# Patient Record
Sex: Female | Born: 1935 | Race: White | Hispanic: No | Smoking: Never smoker
Health system: Southern US, Community
[De-identification: ages and names within clinical notes are randomized; demographics above are authoritative.]

## PROBLEM LIST (undated history)

## (undated) DIAGNOSIS — N189 Chronic kidney disease, unspecified: Secondary | ICD-10-CM

## (undated) DIAGNOSIS — E46 Unspecified protein-calorie malnutrition: Secondary | ICD-10-CM

## (undated) DIAGNOSIS — I10 Essential (primary) hypertension: Secondary | ICD-10-CM

## (undated) HISTORY — PX: THYROIDECTOMY: SHX17

## (undated) HISTORY — PX: ABDOMINAL HYSTERECTOMY: SHX81

## (undated) HISTORY — DX: Chronic kidney disease, unspecified: N18.9

---

## 1998-02-04 ENCOUNTER — Ambulatory Visit (HOSPITAL_BASED_OUTPATIENT_CLINIC_OR_DEPARTMENT_OTHER): Admission: RE | Admit: 1998-02-04 | Discharge: 1998-02-04 | Payer: Self-pay | Admitting: Plastic Surgery

## 1999-12-23 ENCOUNTER — Ambulatory Visit (HOSPITAL_BASED_OUTPATIENT_CLINIC_OR_DEPARTMENT_OTHER): Admission: RE | Admit: 1999-12-23 | Discharge: 1999-12-23 | Payer: Self-pay | Admitting: Plastic Surgery

## 2002-01-17 ENCOUNTER — Ambulatory Visit (HOSPITAL_COMMUNITY): Admission: RE | Admit: 2002-01-17 | Discharge: 2002-01-17 | Payer: Self-pay | Admitting: Surgery

## 2002-01-17 ENCOUNTER — Encounter: Payer: Self-pay | Admitting: Surgery

## 2002-02-15 ENCOUNTER — Encounter: Payer: Self-pay | Admitting: Surgery

## 2002-02-18 ENCOUNTER — Observation Stay (HOSPITAL_COMMUNITY): Admission: RE | Admit: 2002-02-18 | Discharge: 2002-02-19 | Payer: Self-pay | Admitting: Surgery

## 2002-02-18 ENCOUNTER — Encounter (INDEPENDENT_AMBULATORY_CARE_PROVIDER_SITE_OTHER): Payer: Self-pay | Admitting: Specialist

## 2004-12-03 ENCOUNTER — Emergency Department (HOSPITAL_COMMUNITY): Admission: EM | Admit: 2004-12-03 | Discharge: 2004-12-03 | Payer: Self-pay | Admitting: Emergency Medicine

## 2007-12-02 ENCOUNTER — Encounter: Admission: RE | Admit: 2007-12-02 | Discharge: 2007-12-02 | Payer: Self-pay | Admitting: Internal Medicine

## 2010-05-20 ENCOUNTER — Emergency Department (HOSPITAL_COMMUNITY): Payer: Medicare Other

## 2010-05-20 ENCOUNTER — Observation Stay (HOSPITAL_COMMUNITY)
Admission: EM | Admit: 2010-05-20 | Discharge: 2010-05-22 | Disposition: A | Discharge status: Home / Self Care | Payer: Medicare Other | Attending: Internal Medicine | Admitting: Internal Medicine

## 2010-05-20 ENCOUNTER — Encounter (HOSPITAL_COMMUNITY): Payer: Self-pay | Admitting: Radiology

## 2010-05-20 DIAGNOSIS — G459 Transient cerebral ischemic attack, unspecified: Secondary | ICD-10-CM | POA: Insufficient documentation

## 2010-05-20 DIAGNOSIS — F29 Unspecified psychosis not due to a substance or known physiological condition: Principal | ICD-10-CM | POA: Insufficient documentation

## 2010-05-20 DIAGNOSIS — I10 Essential (primary) hypertension: Secondary | ICD-10-CM | POA: Insufficient documentation

## 2010-05-20 DIAGNOSIS — R279 Unspecified lack of coordination: Secondary | ICD-10-CM | POA: Insufficient documentation

## 2010-05-20 DIAGNOSIS — J4489 Other specified chronic obstructive pulmonary disease: Secondary | ICD-10-CM | POA: Insufficient documentation

## 2010-05-20 DIAGNOSIS — J449 Chronic obstructive pulmonary disease, unspecified: Secondary | ICD-10-CM | POA: Insufficient documentation

## 2010-05-20 DIAGNOSIS — M810 Age-related osteoporosis without current pathological fracture: Secondary | ICD-10-CM | POA: Insufficient documentation

## 2010-05-20 HISTORY — DX: Essential (primary) hypertension: I10

## 2010-05-20 LAB — CBC
Hemoglobin: 16.2 g/dL — ABNORMAL HIGH (ref 12.0–15.0)
MCH: 30.3 pg (ref 26.0–34.0)
MCHC: 34.8 g/dL (ref 30.0–36.0)
Platelets: 72 10*3/uL — ABNORMAL LOW (ref 150–400)
RBC: 5.35 MIL/uL — ABNORMAL HIGH (ref 3.87–5.11)

## 2010-05-20 LAB — COMPREHENSIVE METABOLIC PANEL
BUN: 15 mg/dL (ref 6–23)
CO2: 25 mEq/L (ref 19–32)
Calcium: 9.8 mg/dL (ref 8.4–10.5)
Chloride: 100 mEq/L (ref 96–112)
Creatinine, Ser: 1.13 mg/dL (ref 0.4–1.2)
GFR calc non Af Amer: 46 mL/min — ABNORMAL LOW (ref >60)
Total Bilirubin: 0.7 mg/dL (ref 0.3–1.2)

## 2010-05-20 LAB — DIFFERENTIAL
Basophils Absolute: 0 10*3/uL (ref 0.0–0.1)
Basophils Relative: 0 % (ref 0–1)
Eosinophils Absolute: 0 10*3/uL (ref 0.0–0.7)
Monocytes Absolute: 0.4 10*3/uL (ref 0.1–1.0)
Monocytes Relative: 8 % (ref 3–12)
Neutro Abs: 2.9 10*3/uL (ref 1.7–7.7)
Neutrophils Relative %: 62 % (ref 43–77)

## 2010-05-20 LAB — URINALYSIS, ROUTINE W REFLEX MICROSCOPIC
Nitrite: NEGATIVE
Specific Gravity, Urine: 1.01 (ref 1.005–1.030)
Urobilinogen, UA: 0.2 mg/dL (ref 0.0–1.0)
pH: 6 (ref 5.0–8.0)

## 2010-05-20 LAB — PROTIME-INR
INR: 0.93 (ref 0.00–1.49)
Prothrombin Time: 12.7 seconds (ref 11.6–15.2)

## 2010-05-20 LAB — POCT CARDIAC MARKERS: Myoglobin, poc: 84.4 ng/mL (ref 12–200)

## 2010-05-21 DIAGNOSIS — G459 Transient cerebral ischemic attack, unspecified: Secondary | ICD-10-CM

## 2010-05-21 DIAGNOSIS — I10 Essential (primary) hypertension: Secondary | ICD-10-CM

## 2010-05-21 DIAGNOSIS — J449 Chronic obstructive pulmonary disease, unspecified: Secondary | ICD-10-CM

## 2010-05-21 DIAGNOSIS — F29 Unspecified psychosis not due to a substance or known physiological condition: Secondary | ICD-10-CM

## 2010-05-21 LAB — COMPREHENSIVE METABOLIC PANEL
ALT: 18 U/L (ref 0–35)
AST: 21 U/L (ref 0–37)
CO2: 27 mEq/L (ref 19–32)
Chloride: 107 mEq/L (ref 96–112)
Creatinine, Ser: 0.97 mg/dL (ref 0.4–1.2)
GFR calc Af Amer: >60 mL/min (ref >60)
GFR calc non Af Amer: 54 mL/min — ABNORMAL LOW (ref >60)
Sodium: 140 mEq/L (ref 135–145)
Total Bilirubin: 0.9 mg/dL (ref 0.3–1.2)

## 2010-05-21 LAB — CBC
HCT: 40.9 % (ref 36.0–46.0)
Hemoglobin: 13.7 g/dL (ref 12.0–15.0)
MCH: 29.1 pg (ref 26.0–34.0)
RBC: 4.71 MIL/uL (ref 3.87–5.11)

## 2010-05-21 LAB — LIPID PANEL
Total CHOL/HDL Ratio: 5 RATIO
VLDL: 27 mg/dL (ref 0–40)

## 2010-05-21 LAB — TSH: TSH: 4.01 u[IU]/mL (ref 0.350–4.500)

## 2010-05-21 LAB — DIFFERENTIAL
Basophils Relative: 0 % (ref 0–1)
Lymphocytes Relative: 30 % (ref 12–46)
Lymphs Abs: 1 10*3/uL (ref 0.7–4.0)
Monocytes Relative: 11 % (ref 3–12)
Neutro Abs: 1.9 10*3/uL (ref 1.7–7.7)
Neutrophils Relative %: 57 % (ref 43–77)

## 2010-05-21 LAB — URINE CULTURE: Culture  Setup Time: 201203081421

## 2010-05-21 LAB — RPR: RPR Ser Ql: NONREACTIVE

## 2010-05-21 LAB — HEMOGLOBIN A1C
Hgb A1c MFr Bld: 5.7 % — ABNORMAL HIGH (ref <5.7)
Mean Plasma Glucose: 117 mg/dL — ABNORMAL HIGH (ref <117)

## 2010-05-21 LAB — VITAMIN B12: Vitamin B-12: 807 pg/mL (ref 211–911)

## 2010-05-28 NOTE — Discharge Summary (Signed)
NAMEFERN, Amy Ward                ACCOUNT NO.:  [id number]  MEDICAL RECORD NO.:  [id number]           PATIENT TYPE:  I  LOCATION:  3002                         FACILITY:  [facility]  PHYSICIAN:  Thora Lance, M.D.  DATE OF BIRTH:  February 05, 1922  DATE OF ADMISSION:  05/20/2010 DATE OF DISCHARGE:  05/22/2010                              DISCHARGE SUMMARY   REASON FOR ADMISSION:  An 77 year old female with history of hypertension, osteoporosis and COPD for chest x-ray who presented with her son.  On the morning of admission, her son reports she was "not feeling right in the head."  Her mentation seem to be slightly off but no specific neurologic deficits that is visual change, speech change or weakness in extremity.  In the ER, the patient was awake, alert, and oriented.  It was reported by nursing staff that she had some trouble ambulating.  She was admitted to rule out a TIA.  Significant findings at admission; temperature 97.7, blood pressure 157/62, heart rate 92, respirations 20, oxygen saturation 98% on 2 L. Lungs clear.  Heart, regular rate and rhythm without murmur, gallop or rub.  Abdomen was benign.  Neurologic, cranial nerves II-XII were intact.  Chest x-ray showed changes of COPD.  A CT scan of the brain showed mild atrophy and chronic ischemic white matter disease.  MRI of the brain showed no acute intracranial abnormalities.  LABORATORY FINDINGS:  CBC; WBC 4.6, hemoglobin 16.2, hematocrit 46.2, platelet count 72 which has been chronic.  Sodium 138, potassium 3.7, chloride 100, bicarbonate is 22, BUN 15, creatinine 1.13, glucose 119, total bilirubin 0.7, alkaline phos 53, AST 28, ALT 17, total protein 7.3, albumin 4.3, calcium 9.8, troponin-I 0.03.  Urinalysis was negative.  HOSPITAL COURSE:  The patient was admitted for complaints of transient confusion and possible gait disorder.  By the second hospital day she had no complaints and was back to her baseline status.  She  was started on low dose of aspirin.  She was seen by physical therapy on March 10 and in discussion with physical therapy she felt that she was probably back at her baseline.  She did feel like she had some deconditioning and possibly increased fall risk and recommended home physical therapy.  The patient was given IV fluids and BUN and creatinine at discharge was 9 and 0.97.  Urine culture showed no growth.  DISCHARGE DIAGNOSES: 1. Confusion. 2. Possible gait disorder. 3. Hypertension. 4. Osteoporosis.  PROCEDURES: 1. CT scan of the brain without contrast. 2. MRI of the brain.  DISCHARGE MEDICATIONS: 1. Aspirin 81 mg 1 p.o. daily. 2. Citracal 1 tablet b.i.d. 3. Lopressor 25 mg one-half tablet by mouth twice a day. 4. Multivitamin 1 tablet by mouth once a day. 5. Vitamin D 1000 units 2 capsules by mouth 3 times a day.  DISPOSITION:  Discharged to home.  Follow up in 1-2 weeks, Dr. [name].  Home health physical therapy arranged.  CODE STATUS:  Full code.  DIET:  Low-sodium diet.  Activity as tolerated.          ______________________________ Thora Lance, M.D.  JJG/MEDQ  D:  05/22/2010  T:  05/22/2010  Job:  [id number]  cc:   [name], M.D.  Electronically Signed by Kirby Funk M.D. on 05/28/2010 06:21:43 PM

## 2010-06-04 NOTE — H&P (Signed)
Amy Ward, Amy Ward                ACCOUNT NO.:  [id number]  MEDICAL RECORD NO.:  [id number]           PATIENT TYPE:  E  LOCATION:  [facility]                         FACILITY:  [facility]  PHYSICIAN:  Rock Nephew, MD       DATE OF BIRTH:  December 31, 1921  DATE OF ADMISSION:  05/20/2010 DATE OF DISCHARGE:                             HISTORY & PHYSICAL   PRIMARY CARE PHYSICIAN:  [name], MD  CHIEF COMPLAINT:  Confusion and ataxia.  HISTORY OF PRESENT ILLNESS:  This is an 77 year old female with a history of hypertension, osteoporosis, COPD per chest x-ray, and history of parathyroidectomy comes in with a chief complaint of confusion and ataxia.  The patient's son, Amy Ward is present, his phone number is believed to be [phone].  The patient woke up around 6 a.m., called her son that she was not feeling right in the head.  He feels that her mentation is slightly off, but he is not specific on what exactly is off.  The patient is alert, awake, and oriented x3.  She talks excessively.  She just describes that she is off.  However, speaking to the nursing staff, the patient had trouble ambulating.  She is able to speak without difficulty.  The son says that he has not noticed anything unusual in her speech.  The patient came to the Emergency Department. She was hypertensive slightly, however CT of the head was unremarkable. She had an MRI of the brain, which showed no acute intracranial abnormality, chronic calvarial hyperostosis and sclerosis most pronounced on the left anterior convexity as seen on today CTs.  The patient also had a chest x-ray, which showed changes of COPD with chronic bronchitic changes suggest no acute abnormalities.  PAST MEDICAL HISTORY: 1. Hypertension. 2. Osteoporosis. 3. COPD per chest x-ray.  PAST SURGICAL HISTORY: 1. Parathyroidectomy. 2. Hysterectomy.  SOCIAL HISTORY:  The patient is smokes about two cigarettes a week per her.  She has been exposed  to secondhand smoke.  No alcohol, no drugs. She lives for herself.  ALLERGIES:  She is allergic to Codeine.  HOME MEDICATIONS: 1. She takes Lopressor 12.5 mg p.o. b.i.d. 2. She also takes some vitamin D.  REVIEW OF SYSTEMS:  She reports that her head feels different.  She reports she has a history of double vision.  She denies any chest pain or any shortness of breath.  She denies any nausea, vomiting, or abdominal pain.  She denies any constipation or any diarrhea.  She denies any pain in her legs.  PHYSICAL EXAMINATION:  VITAL SIGNS:  Temperature 97.7, blood pressure 157/62, pulse rate has ranged from 92-117, respiratory rate is 20, and 98% saturation on 2 L nasal cannula. HEAD, EYES, EARS, NOSE AND THROAT:  Normocephalic and atraumatic. Pupils are equally round and reactive to light. CARDIOVASCULAR:  She is S1, S2.  Regular rate rhythm.  No murmurs, no rubs. LUNGS:  Clear to auscultation bilaterally.  No wheezes.  No rhonchi. ABDOMEN:  Soft, nontender, nondistended.  Bowel sounds are positive.  No guarding.  No rebound tenderness. EXTREMITIES:  No lower extremity edema is evident.  NEUROLOGIC:  Cranial nerves II through XII are focally intact.  She is little bit slow in the finger-nose-finger touch examination.  RADIOLOGICAL STUDIES:  Chest x-ray shows changes of COPD with chronic bronchitic changes suggests no acute abnormality identified.  The patient's CT of the head shows mild atrophy and chronic ischemic white matter disease.  No acute intracranial abnormality identified.  The patient's MRI of the brain shows no acute intracranial abnormalities, chronic hyperostosis and sclerosis most pronounced on left anterior convexity seen on today's CT.  LABORATORY STUDIES:  The patient's WBC count is 4.6, hemoglobin 16.2, hematocrit 46.2, MCV of 86.9, and platelets are low at 72.  The patient has had thrombocytopenia in the past, neutrophils are 62.  Sodium is 138, potassium 3.7,  chloride 100, bicarbonate 22, BUN 15, creatinine 1.13, glucose 119, total bili 0.7, alk phos 53, AST 28, ALT 17, total protein 7.3, albumin 4.3, calcium 9.8, troponin-I point-of-care marker less than 0.05.  Urinalysis with specific gravity 1.010, negative leukocyte esterase, and negative nitrites.  IMPRESSION AND PLAN:  An 77 year old female admitted with transient confusion and ataxia, nonspecific complaints consisting of transient ischemic attack.  1. Transient ischemic attack, etiology is not clear for this.  The     patient will have a full transient ischemic attack workup.  She has     already had an MRI.  We will get 2-D echo, carotid Dopplers PT/OT     evaluation, and neuro checks. 2. Confusion.  Confusion could be related to transient ischemic     attack, confusion could also be possibly dementia.  We will check     TSH, B12, and RPR. 3. Hypertension.  The patient will be placed on metoprolol 12.5 mg     p.o. b.i.d. which is her home medication dose. 4. Osteoporosis.  Once the med rec was done, the patient will be     continued on calcium and vitamin D. 5. Chronic obstructive pulmonary disease per chest x-ray.  The patient     will get p.r.n. nebulizations as needed for DVT prophylaxis.  The     patient will be placed on Lovenox. 6. Code status was discussed with the patient's family.  The patient     and family wishes for the patient to be DNR.  No code blue.  The patient will be seen by Dr. [name], Deboraha Sprang at [facility] tomorrow.    Rock Nephew, MD    NH/MEDQ  D:  05/20/2010  T:  05/20/2010  Job:  [id number]  cc:   [name], M.D.  Electronically Signed by Rock Nephew MD on 06/04/2010 09:40:37 PM

## 2010-07-30 NOTE — Op Note (Signed)
NAME:  Amy Ward, Amy Ward                          ACCOUNT NO.:  [id number]   MEDICAL RECORD NO.:  [id number]                   PATIENT TYPE:  INP   LOCATION:  NA                                   FACILITY:  [facility]   PHYSICIAN:  Velora Heckler, M.D.                DATE OF BIRTH:  1922-02-19   DATE OF PROCEDURE:  02/18/2002  DATE OF DISCHARGE:                                 OPERATIVE REPORT   PREOPERATIVE DIAGNOSIS:  Primary hyperparathyroidism.   POSTOPERATIVE DIAGNOSIS:  Primary hyperparathyroidism.   OPERATION/PROCEDURE:  Minimally invasive parathyroidectomy (left inferior  gland).   SURGEON:  Velora Heckler, M.D.   ASSISTANT:  Gita Kudo, M.D.   ANESTHESIA:  General per Dr. Krista Blue.   ESTIMATED BLOOD LOSS:  Minimal.   PREPARATION:  Betadine.   COMPLICATIONS:  None.   INDICATIONS:  The patient is a 77 year old white female seen at the request  of Dr. [name] for signs of primary hyperparathyroidism.  The patient  has had elevated serum calcium levels in excess of 12.  Intake PPH level was  measured at 413.  Sestamibi scan shows localization of parathyroid adenoma  in the left inferior position.  The patient is now brought to the operating  room for excision.   DESCRIPTION OF PROCEDURE:  The procedure was done in OR #16 at [facility].  The patient is brought to the operating room and placed  in the supine position on the operating room table.  Following  administration of general anesthesia, the patient was prepped and draped in  the usual aseptic fashion.  After ascertaining an adequate level of  anesthesia had been obtained, the neck is scanned with the NeoProbe.  An  area of increased radioactivity is noted in the low left neck.  A transverse  incision is made with the #10 blade.  Dissection is carried down into the  subcutaneous tissues and through the platysma.  Hemostasis is obtained with  the electrocautery.  A Weitlaner retractor  is placed for exposure.  Skin  flaps are developed just below the platysma cephalad and caudad.  Strap  muscles are incised in the midline and reflected laterally.  Thyroid gland  is identified and rolled medially.  There is a large parathyroid gland in  the left inferior position which is easily identified visually.  This is  confirmed with the NeoProbe to have uptake approximately 2-1/2 times  background levels.  Gland is gently dissected out with blunt dissection  taking care to prevent injury to the recurrent laryngeal nerve.  A vascular  pedicle is isolated over a right angle hemostat.  It is divided between the  small Ligaclips.  Gland is completely excised.  Ex vivo counts are  approximately 250% of background levels.  Gland is submitted fresh to  pathology where frozen section biopsy confirms parathyroid tissue consistent  with parathyroid  adenoma according to Dr. Charlott Rakes.  Good hemostasis is obtained in the left neck.  Small piece of Surgicel was  placed in the bed of the gland.  Strap muscles were reapproximated in the  midline with interrupted 3-0 Vicryl sutures.  Platysma is closed with  interrupted 3-0 Vicryl sutures.  Skin edges are anesthetized with local  Marcaine anesthetic.  Skin edges closed with a running 4-  0 Vicryl subcuticular suture.  Wound was washed and dried and Steri-Strips  are applied.  Sterile gauze dressings are applied.  The patient is awakened  from anesthesia and brought to the recovery room in stable condition.  The  patient tolerated the procedure well.                                                Velora Heckler, M.D.    TMG/MEDQ  D:  02/18/2002  T:  02/18/2002  Job:  [id number]   cc:   [name], M.D.  [address] [place]  [place] [address]  Fax: [phone]

## 2011-02-17 ENCOUNTER — Other Ambulatory Visit (HOSPITAL_COMMUNITY): Payer: Self-pay | Admitting: Internal Medicine

## 2011-02-17 DIAGNOSIS — R1310 Dysphagia, unspecified: Secondary | ICD-10-CM

## 2011-02-17 DIAGNOSIS — K469 Unspecified abdominal hernia without obstruction or gangrene: Secondary | ICD-10-CM

## 2011-02-25 ENCOUNTER — Ambulatory Visit (HOSPITAL_COMMUNITY)
Admission: RE | Admit: 2011-02-25 | Discharge: 2011-02-25 | Disposition: A | Discharge status: Home / Self Care | Payer: Medicare Other | Source: Ambulatory Visit | Attending: Internal Medicine | Admitting: Internal Medicine

## 2011-02-25 DIAGNOSIS — K469 Unspecified abdominal hernia without obstruction or gangrene: Secondary | ICD-10-CM | POA: Insufficient documentation

## 2011-02-25 DIAGNOSIS — R1310 Dysphagia, unspecified: Secondary | ICD-10-CM | POA: Insufficient documentation

## 2011-02-25 NOTE — Procedures (Signed)
Modified Barium Swallow Procedure Note Patient Details  Name: Amy Ward MRN: [id number] Date of Birth: [birth year]  Today's Date: 02/25/2011 Time:  -   HPI: : 77 y.o. female referred for OPMBS secondary to reports of "strangling" with liquids.  Pt with hx of essential hypertension, diplopia, and anxiety.  Pt states that she has a large hiatal hernia.  Daughter present for study.   Clinical Impression: Suspected primary esophageal dysphagia.   Pt presents with a normal oropharyngeal swallow.  Esophageal screen revealed barium stasis, often filling esophageal column.  Backflow of barium into cervical esophagus also observed.  Recommendations:  1.  Continue regular diet with thin liquids.  2.  Consider esophageal assessment  Postural Changes and/or Swallow Maneuvers: Seated upright 90 degrees; Upright 30-60 min after meal   Individuals Consulted Report Sent to : Referring physician   General:  Other Pertinent Information Type of Study: Initial MBS Diet Prior to this Study: Regular;Thin liquids Temperature Spikes Noted: No Respiratory Status: Room air History of Intubation: No Behavior/Cognition: Alert;Distractible Oral Cavity - Dentition: Adequate natural dentition Oral Motor / Sensory Function: Within functional limits Patient Positioning: Upright in chair Baseline Vocal Quality: Normal Volitional Cough: Strong Volitional Swallow: Able to elicit Anatomy: Within functional limits Pharyngeal Secretions: Not observed secondary MBS Ice chips: Not tested   Oral Phase Oral Preparation/Oral Phase Oral Phase: Impaired Oral - Solids Oral - Puree: Other (Comment) (prolonged mastication, but thorough) Oral - Mechanical Soft: Other (Comment) (prolonged mastication, but thorough) Pharyngeal Phase  Pharyngeal Phase Pharyngeal Phase: Within functional limits Cervical Esophageal Phase  Cervical Esophageal Phase Cervical Esophageal Phase: Impaired Cervical Esophageal Phase -  Solids Puree: Esophageal backflow into cervical esophagus Mechanical Soft: Esophageal backflow into cervical esophagus   Amy Ward L. Amy Ward, [place] CCC/SLP Pager [phone]  Amy Ward Amy Ward 02/25/2011, 12:53 PM

## 2011-03-11 ENCOUNTER — Other Ambulatory Visit (HOSPITAL_COMMUNITY): Payer: Self-pay | Admitting: Internal Medicine

## 2011-03-11 DIAGNOSIS — K219 Gastro-esophageal reflux disease without esophagitis: Secondary | ICD-10-CM

## 2011-03-18 ENCOUNTER — Ambulatory Visit (HOSPITAL_COMMUNITY)
Admission: RE | Admit: 2011-03-18 | Discharge: 2011-03-18 | Disposition: A | Discharge status: Home / Self Care | Payer: Medicare Other | Source: Ambulatory Visit | Attending: Internal Medicine | Admitting: Internal Medicine

## 2011-03-18 DIAGNOSIS — K219 Gastro-esophageal reflux disease without esophagitis: Secondary | ICD-10-CM | POA: Insufficient documentation

## 2012-07-26 ENCOUNTER — Ambulatory Visit (INDEPENDENT_AMBULATORY_CARE_PROVIDER_SITE_OTHER): Payer: Medicare Other | Admitting: Cardiology

## 2012-07-26 ENCOUNTER — Encounter: Payer: Self-pay | Admitting: Cardiology

## 2012-07-26 VITALS — BP 124/80 | HR 100 | Wt 93.8 lb

## 2012-07-26 DIAGNOSIS — R634 Abnormal weight loss: Secondary | ICD-10-CM

## 2012-07-26 DIAGNOSIS — R000 Tachycardia, unspecified: Secondary | ICD-10-CM | POA: Insufficient documentation

## 2012-07-26 DIAGNOSIS — I498 Other specified cardiac arrhythmias: Secondary | ICD-10-CM

## 2012-07-26 LAB — T4, FREE: Free T4: 1.37 ng/dL (ref 0.60–1.60)

## 2012-07-26 NOTE — Progress Notes (Signed)
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  I have known her since 69.  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  I am seeing her today at the request of Dr. [name] in regard to fast heart rate.  The patient states that her heart rate has been fast recently.  She has not been experiencing any chest pain or angina.  She does not have any history of congestive heart failure.  She had a normal B. natruretic peptide drawn on 07/04/12.  She had a recent chest x-ray 07/04/12 at Dr. Newell Coral office which showed changes suggestive of COPD but no evidence of congestive heart failure or cardiomegaly.  The patient is a nonsmoker.  The patient has had significant weight loss over the past several years.  She states that she formally weighed about 130 pounds but it is not clear when she weighed this much.  Presently she weighs 93 pounds and her appetite is poor to fair at best.  She had a recent echocardiogram done at Dr. Newell Coral office and interpreted by Dr. stains which showed grade 1 diastolic dysfunction and normal left ventricular systolic function with an ejection fraction of 65-70%.  There was mild mitral regurgitation.  The patient is presently taking metoprolol 12.5 mg twice a day and has noted improvement in her resting heart rate since starting metoprolol.  Current Outpatient Prescriptions  Medication Sig Dispense Refill  . acetaminophen (TYLENOL) 500 MG tablet Take 500 mg by mouth every 6 (six) hours as needed for pain.      . Calcium Citrate-Vitamin D (CALCIUM CITRATE + D) 315-250 MG-UNIT TABS Take by mouth daily.      . Cholecalciferol (VITAMIN D-3) 1000 UNITS CAPS Take 1,000 Units by mouth daily.      Marland Kitchen docusate sodium (COLACE) 100 MG  capsule Take 100 mg by mouth as needed for constipation.      . metoprolol tartrate (LOPRESSOR) 25 MG tablet Take 12.5 mg by mouth 3 (three) times daily.       . Multiple Vitamin (DAILY MULTIVITAMIN PO) Take by mouth daily.      Marland Kitchen omeprazole (PRILOSEC) 20 MG capsule Take 20 mg by mouth daily.      . sodium chloride (NASAL MOISTURIZING SPRAY) 0.65 % nasal spray Place 1 spray into the nose as needed for congestion.       No current facility-administered medications for this visit.    Allergies  Allergen Reactions  . Codeine     Patient Active Problem List   Diagnosis Date Noted  . Weight loss, unintentional 07/26/2012  . Sinus tachycardia 07/26/2012    History  Smoking status  . Never Smoker   Smokeless tobacco  . Not on file    History  Alcohol Use: Not on file    No family history on file.  Review of Systems: Constitutional: no fever chills diaphoresis or fatigue.  Positive for weight loss, non-intentional Head and neck:  no epistaxis, no photophobia or visual disturbance.  Patient is hard of hearing Respiratory: No cough, shortness of breath or wheezing. Cardiovascular: No chest pain peripheral edema, palpitations. Gastrointestinal: No abdominal distention, no abdominal pain, no change in bowel habits hematochezia or  melena. Genitourinary: No dysuria, no frequency, no urgency, no nocturia. Musculoskeletal:No arthralgias, no back pain, no gait disturbance or myalgias. Neurological: No dizziness, no headaches, no numbness, no seizures, no syncope, no weakness, no tremors. Hematologic: No lymphadenopathy, no easy bruising. Psychiatric: No confusion, no hallucinations, no sleep disturbance.    Physical Exam: Filed Vitals:   07/26/12 1209  BP: 124/80  Pulse: 100   the general appearance reveals a very talkative elderly woman in no distress.The head and neck exam reveals pupils equal and reactive.  Extraocular movements are full.  There is no scleral icterus.  The mouth  and pharynx are normal.  The neck is supple.  The carotids reveal no bruits.  The jugular venous pressure is normal.  The  thyroid is not enlarged.  There is no lymphadenopathy.  The chest is clear to percussion and auscultation.  There are no rales or rhonchi.  Expansion of the chest is symmetrical.  The precordium is quiet.  The first heart sound is normal.  The second heart sound is physiologically split.  There is no murmur gallop rub or click.  There is no abnormal lift or heave.  The abdomen is soft and nontender.  The bowel sounds are normal.  The liver and spleen are not enlarged.  There are no abdominal masses.  There are no abdominal bruits.  Extremities reveal good pedal pulses.  There is no phlebitis and there is mild bilateral ankle edema worse on the left.  There is no cyanosis or clubbing.  Strength is normal and symmetrical in all extremities.  There is no lateralizing weakness.  There are no sensory deficits.  The skin is warm and dry.  There is a lesion on her right cheek suggesting possible recurrence of a previous skin cancer.  EKG today shows normal sinus rhythm at a rate of 100 with short PR and with occasional PAC.  No ischemic changes.  Her chest x-ray was reviewed on the computer disk which the patient brought with her and the chest x-ray shows hyperinflation suggestive of COPD and no evidence of CHF.    Assessment / Plan: My impression is that the patient does not have any significant intrinsic cardiac disease.  She does have unexplained sinus tachycardia.  Recent lab work showed no evidence of anemia. She does have a past history of overactive parathyroid gland and had surgery by Dr. Gerrit Friends in 2003.  In view of her history of weight loss and her borderline sinus tachycardia we will obtain TSH and free T4 today to rule out apathetic hyperthyroidism as a cause of her current symptoms.  For symptomatic treatment of her sinus tachycardia I have asked her to increase her metoprolol up  to 12.5 mg 3 times a day instead of twice a day. Many thanks for the opportunity to see this pleasant lady with you.

## 2012-07-26 NOTE — Patient Instructions (Signed)
Will obtain labs today and call you with the results (tsh/[id number])  INCREASE YOUR LOPRESSOR (METOPROLOL) TO 12.5 MG THREE TIMES A DAY  Follow up as needed

## 2012-08-09 ENCOUNTER — Encounter: Payer: Self-pay | Admitting: Cardiology

## 2012-10-12 ENCOUNTER — Inpatient Hospital Stay (HOSPITAL_COMMUNITY)
Admission: EM | Admit: 2012-10-12 | Discharge: 2012-10-19 | DRG: 389 | Disposition: A | Discharge status: Skilled Nursing Facility | Payer: Medicare Other | Attending: Internal Medicine | Admitting: Internal Medicine

## 2012-10-12 ENCOUNTER — Encounter (HOSPITAL_COMMUNITY): Payer: Self-pay | Admitting: Adult Health

## 2012-10-12 DIAGNOSIS — K56609 Unspecified intestinal obstruction, unspecified as to partial versus complete obstruction: Secondary | ICD-10-CM | POA: Diagnosis present

## 2012-10-12 DIAGNOSIS — R634 Abnormal weight loss: Secondary | ICD-10-CM | POA: Diagnosis present

## 2012-10-12 DIAGNOSIS — E871 Hypo-osmolality and hyponatremia: Secondary | ICD-10-CM | POA: Diagnosis present

## 2012-10-12 DIAGNOSIS — E876 Hypokalemia: Secondary | ICD-10-CM | POA: Diagnosis not present

## 2012-10-12 DIAGNOSIS — N390 Urinary tract infection, site not specified: Secondary | ICD-10-CM | POA: Diagnosis present

## 2012-10-12 DIAGNOSIS — R7989 Other specified abnormal findings of blood chemistry: Secondary | ICD-10-CM

## 2012-10-12 DIAGNOSIS — D696 Thrombocytopenia, unspecified: Secondary | ICD-10-CM | POA: Diagnosis not present

## 2012-10-12 DIAGNOSIS — Z66 Do not resuscitate: Secondary | ICD-10-CM | POA: Diagnosis not present

## 2012-10-12 DIAGNOSIS — I10 Essential (primary) hypertension: Secondary | ICD-10-CM | POA: Diagnosis present

## 2012-10-12 DIAGNOSIS — R911 Solitary pulmonary nodule: Secondary | ICD-10-CM | POA: Diagnosis present

## 2012-10-12 NOTE — ED Notes (Addendum)
Presents with sudden onset of nausea and vomiting that began last night associated with lower abdominal pain and chills. Feeling better today. Able to drink some water.  Denies diarrhea

## 2012-10-12 NOTE — ED Notes (Signed)
C/o pain after voiding x 2-3 days

## 2012-10-12 NOTE — ED Notes (Signed)
Also c/o urinary frequency x 2 days

## 2012-10-12 NOTE — ED Notes (Signed)
Pt states that she has been under a lot of stress d/t grandaughter lately

## 2012-10-13 ENCOUNTER — Emergency Department (HOSPITAL_COMMUNITY): Payer: Medicare Other

## 2012-10-13 ENCOUNTER — Encounter (HOSPITAL_COMMUNITY): Payer: Self-pay | Admitting: Internal Medicine

## 2012-10-13 DIAGNOSIS — N390 Urinary tract infection, site not specified: Secondary | ICD-10-CM

## 2012-10-13 DIAGNOSIS — I10 Essential (primary) hypertension: Secondary | ICD-10-CM

## 2012-10-13 DIAGNOSIS — K56609 Unspecified intestinal obstruction, unspecified as to partial versus complete obstruction: Secondary | ICD-10-CM | POA: Diagnosis present

## 2012-10-13 DIAGNOSIS — R911 Solitary pulmonary nodule: Secondary | ICD-10-CM | POA: Diagnosis present

## 2012-10-13 LAB — CBC WITH DIFFERENTIAL/PLATELET
Eosinophils Absolute: 0 10*3/uL (ref 0.0–0.7)
Eosinophils Relative: 0 % (ref 0–5)
MCH: 29.7 pg (ref 26.0–34.0)
Monocytes Absolute: 0.4 10*3/uL (ref 0.1–1.0)
Neutrophils Relative %: 87 % — ABNORMAL HIGH (ref 43–77)
Platelets: 89 10*3/uL — ABNORMAL LOW (ref 150–400)
RBC: 5.43 MIL/uL — ABNORMAL HIGH (ref 3.87–5.11)
Smear Review: DECREASED
WBC: 8.3 10*3/uL (ref 4.0–10.5)

## 2012-10-13 LAB — URINALYSIS, ROUTINE W REFLEX MICROSCOPIC
Glucose, UA: NEGATIVE mg/dL
Specific Gravity, Urine: 1.025 (ref 1.005–1.030)
pH: 5.5 (ref 5.0–8.0)

## 2012-10-13 LAB — COMPREHENSIVE METABOLIC PANEL
ALT: 15 U/L (ref 0–35)
AST: 19 U/L (ref 0–37)
Albumin: 3.7 g/dL (ref 3.5–5.2)
CO2: 31 mEq/L (ref 19–32)
Calcium: 9.8 mg/dL (ref 8.4–10.5)
Sodium: 134 mEq/L — ABNORMAL LOW (ref 135–145)
Total Protein: 7.4 g/dL (ref 6.0–8.3)

## 2012-10-13 LAB — URINE MICROSCOPIC-ADD ON

## 2012-10-13 LAB — CG4 I-STAT (LACTIC ACID): Lactic Acid, Venous: 1.56 mmol/L (ref 0.5–2.2)

## 2012-10-13 MED ORDER — DIPHENHYDRAMINE HCL 50 MG/ML IJ SOLN
25.0000 mg | Freq: Once | INTRAMUSCULAR | Status: AC
  Administered 2012-10-13: 25 mg via INTRAVENOUS
  Filled 2012-10-13: qty 1

## 2012-10-13 MED ORDER — HEPARIN SODIUM (PORCINE) 5000 UNIT/ML IJ SOLN
5000.0000 [IU] | Freq: Three times a day (TID) | INTRAMUSCULAR | Status: DC
  Administered 2012-10-13 – 2012-10-16 (×9): 5000 [IU] via SUBCUTANEOUS
  Filled 2012-10-13 (×13): qty 1

## 2012-10-13 MED ORDER — SODIUM CHLORIDE 0.9 % IV SOLN
INTRAVENOUS | Status: DC
  Administered 2012-10-13 – 2012-10-16 (×5): via INTRAVENOUS

## 2012-10-13 MED ORDER — DEXTROSE 5 % IV SOLN
1.0000 g | Freq: Once | INTRAVENOUS | Status: AC
  Administered 2012-10-13: 1 g via INTRAVENOUS
  Filled 2012-10-13: qty 10

## 2012-10-13 MED ORDER — IOHEXOL 300 MG/ML  SOLN
80.0000 mL | Freq: Once | INTRAMUSCULAR | Status: AC | PRN
  Administered 2012-10-13: 80 mL via INTRAVENOUS

## 2012-10-13 MED ORDER — HYDROMORPHONE HCL PF 1 MG/ML IJ SOLN
1.0000 mg | INTRAMUSCULAR | Status: AC | PRN
  Filled 2012-10-13: qty 1

## 2012-10-13 MED ORDER — SODIUM CHLORIDE 0.9 % IV SOLN: INTRAVENOUS | Status: AC

## 2012-10-13 MED ORDER — SODIUM CHLORIDE 0.9 % IV SOLN: 1000.0000 mL | INTRAVENOUS | Status: DC

## 2012-10-13 MED ORDER — SODIUM CHLORIDE 0.9 % IV SOLN
1000.0000 mL | Freq: Once | INTRAVENOUS | Status: AC
  Administered 2012-10-13: 1000 mL via INTRAVENOUS

## 2012-10-13 MED ORDER — ONDANSETRON HCL 4 MG/2ML IJ SOLN
4.0000 mg | Freq: Once | INTRAMUSCULAR | Status: AC
  Administered 2012-10-13: 4 mg via INTRAVENOUS
  Filled 2012-10-13: qty 2

## 2012-10-13 MED ORDER — METOCLOPRAMIDE HCL 5 MG/ML IJ SOLN
10.0000 mg | Freq: Once | INTRAMUSCULAR | Status: AC
  Administered 2012-10-13: 10 mg via INTRAVENOUS
  Filled 2012-10-13: qty 2

## 2012-10-13 MED ORDER — ONDANSETRON HCL 4 MG/2ML IJ SOLN: 4.0000 mg | Freq: Three times a day (TID) | INTRAMUSCULAR | Status: AC | PRN

## 2012-10-13 MED ORDER — ONDANSETRON HCL 4 MG/2ML IJ SOLN
4.0000 mg | Freq: Four times a day (QID) | INTRAMUSCULAR | Status: DC | PRN
  Administered 2012-10-14: 4 mg via INTRAVENOUS
  Filled 2012-10-13: qty 2

## 2012-10-13 MED ORDER — ONDANSETRON HCL 4 MG PO TABS: 4.0000 mg | ORAL_TABLET | Freq: Four times a day (QID) | ORAL | Status: DC | PRN

## 2012-10-13 MED ORDER — CEFTRIAXONE SODIUM 1 G IJ SOLR
1.0000 g | INTRAMUSCULAR | Status: DC
  Administered 2012-10-13: 1 g via INTRAVENOUS
  Filled 2012-10-13 (×2): qty 10

## 2012-10-13 MED ORDER — IOHEXOL 300 MG/ML  SOLN
25.0000 mL | INTRAMUSCULAR | Status: AC
  Administered 2012-10-13: 25 mL via ORAL

## 2012-10-13 NOTE — Progress Notes (Signed)
Amy Ward [id number] Jan 27, 1922  Pt states she has not vomited since early this morning.  Abdominal pain has improved, no flatus or bm yet.  Unable to place NGT is ED x2 attempt.  She appears comfortable at present time.  Place ngt if pt develops n/v.  We discussed the role of surgery, states if this is necessary in the future that she is not sure whether she wants surgery.  I spoke with her son at the patients request.  We will continue to follow up.  Ashok Norris, [facility] Surgery Pager [phone] Office [phone]  10/13/2012 12:29 PM

## 2012-10-13 NOTE — Progress Notes (Signed)
Subjective: Admission history and physical reviewed, surgical note reviewed. Patient slightly confused, no apparent distress. Denies abdominal pain at this time  Objective: Vital signs in last 24 hours: Temp:  [97.4 F (36.3 C)-98.4 F (36.9 C)] 98.4 F (36.9 C) (08/02 0833) Pulse Rate:  [83-136] 100 (08/02 0833) Resp:  [13-18] 18 (08/02 0833) BP: (133-169)/(59-116) 166/81 mmHg (08/02 0833) SpO2:  [91 %-100 %] 96 % (08/02 0833) Weight change:     Intake/Output from previous day:   Intake/Output this shift:    Resp: clear to auscultation bilaterally Cardio: regular rate and rhythm GI: Soft, positive bowel sounds  Lab Results:  Results for orders placed during the hospital encounter of 10/12/12 (from the past 24 hour(s))  CBC WITH DIFFERENTIAL     Status: Abnormal   Collection Time    10/13/12 12:30 AM      Result Value Range   WBC 8.3  4.0 - 10.5 K/uL   RBC 5.43 (*) 3.87 - 5.11 MIL/uL   Hemoglobin 16.1 (*) 12.0 - 15.0 g/dL   HCT 16.1  09.6 - 04.5 %   MCV 82.0  78.0 - 100.0 fL   MCH 29.7  26.0 - 34.0 pg   MCHC 36.2 (*) 30.0 - 36.0 g/dL   RDW 40.9  81.1 - 91.4 %   Platelets 89 (*) 150 - 400 K/uL   Neutrophils Relative % 87 (*) 43 - 77 %   Lymphocytes Relative 8 (*) 12 - 46 %   Monocytes Relative 5  3 - 12 %   Eosinophils Relative 0  0 - 5 %   Basophils Relative 0  0 - 1 %   Neutro Abs 7.2  1.7 - 7.7 K/uL   Lymphs Abs 0.7  0.7 - 4.0 K/uL   Monocytes Absolute 0.4  0.1 - 1.0 K/uL   Eosinophils Absolute 0.0  0.0 - 0.7 K/uL   Basophils Absolute 0.0  0.0 - 0.1 K/uL   Smear Review PLATELETS APPEAR DECREASED    COMPREHENSIVE METABOLIC PANEL     Status: Abnormal   Collection Time    10/13/12 12:30 AM      Result Value Range   Sodium 134 (*) 135 - 145 mEq/L   Potassium 4.1  3.5 - 5.1 mEq/L   Chloride 93 (*) 96 - 112 mEq/L   CO2 31  19 - 32 mEq/L   Glucose, Bld 125 (*) 70 - 99 mg/dL   BUN 25 (*) 6 - 23 mg/dL   Creatinine, Ser 7.82  0.50 - 1.10 mg/dL   Calcium 9.8  8.4  - 95.6 mg/dL   Total Protein 7.4  6.0 - 8.3 g/dL   Albumin 3.7  3.5 - 5.2 g/dL   AST 19  0 - 37 U/L   ALT 15  0 - 35 U/L   Alkaline Phosphatase 67  39 - 117 U/L   Total Bilirubin 0.6  0.3 - 1.2 mg/dL   GFR calc non Af Amer 58 (*) >90 mL/min   GFR calc Af Amer 67 (*) >90 mL/min  LIPASE, BLOOD     Status: None   Collection Time    10/13/12 12:30 AM      Result Value Range   Lipase 48  11 - 59 U/L  CG4 I-STAT (LACTIC ACID)     Status: None   Collection Time    10/13/12 12:35 AM      Result Value Range   Lactic Acid, Venous 1.56  0.5 - 2.2 mmol/L  URINALYSIS, ROUTINE W REFLEX MICROSCOPIC     Status: Abnormal   Collection Time    10/13/12  1:04 AM      Result Value Range   Color, Urine AMBER (*) YELLOW   APPearance CLOUDY (*) CLEAR   Specific Gravity, Urine 1.025  1.005 - 1.030   pH 5.5  5.0 - 8.0   Glucose, UA NEGATIVE  NEGATIVE mg/dL   Hgb urine dipstick TRACE (*) NEGATIVE   Bilirubin Urine SMALL (*) NEGATIVE   Ketones, ur NEGATIVE  NEGATIVE mg/dL   Protein, ur NEGATIVE  NEGATIVE mg/dL   Urobilinogen, UA 1.0  0.0 - 1.0 mg/dL   Nitrite NEGATIVE  NEGATIVE   Leukocytes, UA SMALL (*) NEGATIVE  URINE MICROSCOPIC-ADD ON     Status: Abnormal   Collection Time    10/13/12  1:04 AM      Result Value Range   Squamous Epithelial / LPF FEW (*) RARE   WBC, UA 21-50  <3 WBC/hpf   RBC / HPF 0-2  <3 RBC/hpf   Bacteria, UA RARE  RARE   Urine-Other MUCOUS PRESENT        Studies/Results: Ct Abdomen Pelvis W Contrast  10/13/2012   *RADIOLOGY REPORT*  Clinical Data: Emesis.  CT ABDOMEN AND PELVIS WITH CONTRAST  Technique:  Multidetector CT imaging of the abdomen and pelvis was performed following the standard protocol during bolus administration of intravenous contrast.  Contrast: 80mL OMNIPAQUE IOHEXOL 300 MG/ML  SOLN  Comparison: None.  Findings:  BODY WALL: Unremarkable.  LOWER CHEST:  Mediastinum:  Moderate hiatal hernia with non clearance or reflux of enteric contrast.  Distal  esophageal thickening likely from reflux esophagitis.  Lungs/pleura: No consolidation.There are numerous small peripheral pulmonary nodules, some which are calcified.  Most are 5 mm or smaller.  ABDOMEN/PELVIS:  Liver: No focal abnormality.  Biliary: No evidence of biliary obstruction or stone.  Pancreas: Unremarkable.  Spleen: Unremarkable.  Adrenals: Bilateral adrenal thickening without focal nodule.  Kidneys and ureters: Punctate stone in the lower pole right kidney. Tiny cortical low attenuation foci, likely cysts.  Bladder: Unremarkable.  Bowel: Dilated and fluid-filled proximal small bowel, measuring up to 3.8 cm.  There is an abrupt transition point in the low left pelvis.  The segment of bowel beyond the transition point shows marked mural thickening and poor mucosal enhancement.  The distal bowel is decompressed.  Extensive colonic diverticulosis.  No evidence of pneumatosis or free air.  No evidence of large vessel occlusion, although limited for this purpose.  Retroperitoneum: No mass or adenopathy.  Peritoneum: Small-volume ascites.  No free air.  Reproductive: Hysterectomy.  Vascular: Extensive atherosclerotic calcification. 1 cm distal splenic artery aneurysm.  OSSEOUS: No acute abnormalities. No suspicious lytic or blastic lesions.  Critical Value/emergent results were called by telephone at the time of interpretation on 10/13/2012 at 05:10 to Dr Preston Fleeting, who verbally acknowledged these results.  IMPRESSION:  1.  High-grade proximal small bowel obstruction with transition in the left pelvis, likely from adhesions.  The bowel just beyond the transition point is edematous and possibly ischemic. 2. Numerous small pulmonary nodules. Remote granulomatous infection or neoplastic disease are the primary considerations.  After convalescence, recommend chest CT.  3. Hiatal hernia with reflux or poor distal esophageal clearance.  4.  1 cm splenic artery aneurysm.   Original Report Authenticated By: Tiburcio Pea    Dg Abd Acute W/chest  10/13/2012   *RADIOLOGY REPORT*  Clinical Data: 79 year old female with abdominal and pelvic pain and  vomiting.  ACUTE ABDOMEN SERIES (ABDOMEN 2 VIEW & CHEST 1 VIEW)  Comparison: 07/04/2012 and prior chest radiographs dating back to 05/20/2010.  Findings: The cardiomediastinal silhouette is unremarkable. There is no evidence of airspace disease, pleural effusion, consolidation, or pulmonary mass.  No pneumothorax identified.  The bowel gas pattern is unremarkable. There is no evidence of bowel obstruction or pneumoperitoneum. No acute or suspicious bony abnormalities are noted. No suspicious calcifications are identified.  IMPRESSION: No evidence of acute abnormality.  Unremarkable bowel gas pattern.   Original Report Authenticated By: Harmon Pier, M.D.    Medications:  Prior to Admission:  Prescriptions prior to admission  Medication Sig Dispense Refill  . Calcium Citrate-Vitamin D (CALCIUM CITRATE + D) 315-250 MG-UNIT TABS Take 1 tablet by mouth 2 (two) times daily.       . Cholecalciferol (VITAMIN D-3) 1000 UNITS CAPS Take 1,000 Units by mouth daily.      . metoprolol tartrate (LOPRESSOR) 25 MG tablet Take 12.5 mg by mouth 3 (three) times daily.       . Multiple Vitamin (MULTIVITAMIN WITH MINERALS) TABS Take 1 tablet by mouth daily.       Scheduled: . sodium chloride   Intravenous STAT  . cefTRIAXone (ROCEPHIN)  IV  1 g Intravenous Q24H  . heparin  5,000 Units Subcutaneous Q8H   Continuous: . sodium chloride    . sodium chloride 75 mL/hr at 10/13/12 1007    Assessment/Plan: Small bowel obstruction, NG tube not placed, currently patient comfortable, followup imaging scheduled for the morning. Concern for UTI, UA reviewed , there are white blood cells, rare bacteria, She has received Rocephin times one continue antibiotics pending results History of hypertension CT noting numerous small pulmonary nodules, followup CT recommended  LOS: 1 day   Nashton Belson  D 10/13/2012, 12:21 PM

## 2012-10-13 NOTE — ED Provider Notes (Signed)
CSN: [id number]     Arrival date & time 10/12/12  2325 History     First MD Initiated Contact with Patient 10/13/12 0006     Chief Complaint  Patient presents with  . Emesis   (Consider location/radiation/quality/duration/timing/severity/associated sxs/prior Treatment) Patient is a 79 y.o. female presenting with vomiting. The history is provided by the patient.  Emesis She had onset yesterday of diffuse abdominal pain with associated nausea and vomiting. She's unable to put a number of the pain other than to state that it was bad. Nothing made it better nothing made it worse. It did not improve after vomiting. She did not have any diarrhea and, in fact, did not have a bowel movement or passed any flatus. Today, nausea persists and pain has decreased but is still present. Again, she is unable to put a number on the pain. She did not have any bowel movement or passed any flatus today. She did eat a small amount of soup and this did not make the pain better or worse. She cannot recall having had pain like this before. She did not take anything at home to improve the pain. She had subjective fever and felt alternately hot and cold but had no overt chills and no overt sweats. She is also noted some urinary frequency without dysuria or urgency or tenesmus. She has had abdominal surgery in that she is status post hysterectomy.  Past Medical History  Diagnosis Date  . Hypertension    History reviewed. No pertinent past surgical history. History reviewed. No pertinent family history. History  Substance Use Topics  . Smoking status: Never Smoker   . Smokeless tobacco: Not on file  . Alcohol Use: Not on file   OB History   Grav Para Term Preterm Abortions TAB SAB Ect Mult Living                 Review of Systems  Gastrointestinal: Positive for vomiting.  All other systems reviewed and are negative.    Allergies  Codeine  Home Medications   Current Outpatient Rx  Name  Route  Sig   Dispense  Refill  . Calcium Citrate-Vitamin D (CALCIUM CITRATE + D) 315-250 MG-UNIT TABS   Oral   Take 1 tablet by mouth 2 (two) times daily.          . Cholecalciferol (VITAMIN D-3) 1000 UNITS CAPS   Oral   Take 1,000 Units by mouth daily.         . metoprolol tartrate (LOPRESSOR) 25 MG tablet   Oral   Take 12.5 mg by mouth 3 (three) times daily.          . Multiple Vitamin (MULTIVITAMIN WITH MINERALS) TABS   Oral   Take 1 tablet by mouth daily.          BP 146/116  Pulse 83  Temp(Src) 97.4 F (36.3 C) (Oral)  Resp 16  SpO2 97% Physical Exam  Nursing note and vitals reviewed.  79 year old female, resting comfortably and in no acute distress. Vital signs are significant for hypertension with blood pressure 146/116. Oxygen saturation is 97%, which is normal. Head is normocephalic and atraumatic. PERRLA, EOMI. Oropharynx is clear. Neck is nontender and supple without adenopathy or JVD. Back is nontender and there is no CVA tenderness. Lungs are clear without rales, wheezes, or rhonchi. Chest is nontender. Heart has regular rate and rhythm without murmur. Abdomen is soft, flat, nontender without masses or hepatosplenomegaly and peristalsis is hypoactive.  Extremities have no cyanosis or edema, full range of motion is present. Skin is warm and dry without rash. Neurologic: Mental status is normal, cranial nerves are intact, there are no motor or sensory deficits.  ED Course   Procedures (including critical care time)  Results for orders placed during the hospital encounter of 10/12/12  CBC WITH DIFFERENTIAL      Result Value Range   WBC 8.3  4.0 - 10.5 K/uL   RBC 5.43 (*) 3.87 - 5.11 MIL/uL   Hemoglobin 16.1 (*) 12.0 - 15.0 g/dL   HCT 30.8  65.7 - 84.6 %   MCV 82.0  78.0 - 100.0 fL   MCH 29.7  26.0 - 34.0 pg   MCHC 36.2 (*) 30.0 - 36.0 g/dL   RDW 96.2  95.2 - 84.1 %   Platelets 89 (*) 150 - 400 K/uL   Neutrophils Relative % 87 (*) 43 - 77 %   Lymphocytes  Relative 8 (*) 12 - 46 %   Monocytes Relative 5  3 - 12 %   Eosinophils Relative 0  0 - 5 %   Basophils Relative 0  0 - 1 %   Neutro Abs 7.2  1.7 - 7.7 K/uL   Lymphs Abs 0.7  0.7 - 4.0 K/uL   Monocytes Absolute 0.4  0.1 - 1.0 K/uL   Eosinophils Absolute 0.0  0.0 - 0.7 K/uL   Basophils Absolute 0.0  0.0 - 0.1 K/uL   Smear Review PLATELETS APPEAR DECREASED    COMPREHENSIVE METABOLIC PANEL      Result Value Range   Sodium 134 (*) 135 - 145 mEq/L   Potassium 4.1  3.5 - 5.1 mEq/L   Chloride 93 (*) 96 - 112 mEq/L   CO2 31  19 - 32 mEq/L   Glucose, Bld 125 (*) 70 - 99 mg/dL   BUN 25 (*) 6 - 23 mg/dL   Creatinine, Ser 3.24  0.50 - 1.10 mg/dL   Calcium 9.8  8.4 - 40.1 mg/dL   Total Protein 7.4  6.0 - 8.3 g/dL   Albumin 3.7  3.5 - 5.2 g/dL   AST 19  0 - 37 U/L   ALT 15  0 - 35 U/L   Alkaline Phosphatase 67  39 - 117 U/L   Total Bilirubin 0.6  0.3 - 1.2 mg/dL   GFR calc non Af Amer 58 (*) >90 mL/min   GFR calc Af Amer 67 (*) >90 mL/min  LIPASE, BLOOD      Result Value Range   Lipase 48  11 - 59 U/L  URINALYSIS, ROUTINE W REFLEX MICROSCOPIC      Result Value Range   Color, Urine AMBER (*) YELLOW   APPearance CLOUDY (*) CLEAR   Specific Gravity, Urine 1.025  1.005 - 1.030   pH 5.5  5.0 - 8.0   Glucose, UA NEGATIVE  NEGATIVE mg/dL   Hgb urine dipstick TRACE (*) NEGATIVE   Bilirubin Urine SMALL (*) NEGATIVE   Ketones, ur NEGATIVE  NEGATIVE mg/dL   Protein, ur NEGATIVE  NEGATIVE mg/dL   Urobilinogen, UA 1.0  0.0 - 1.0 mg/dL   Nitrite NEGATIVE  NEGATIVE   Leukocytes, UA SMALL (*) NEGATIVE  URINE MICROSCOPIC-ADD ON      Result Value Range   Squamous Epithelial / LPF FEW (*) RARE   WBC, UA 21-50  <3 WBC/hpf   RBC / HPF 0-2  <3 RBC/hpf   Bacteria, UA RARE  RARE   Urine-Other MUCOUS  PRESENT    CG4 I-STAT (LACTIC ACID)      Result Value Range   Lactic Acid, Venous 1.56  0.5 - 2.2 mmol/L   Ct Abdomen Pelvis W Contrast  10/13/2012   *RADIOLOGY REPORT*  Clinical Data: Emesis.  CT  ABDOMEN AND PELVIS WITH CONTRAST  Technique:  Multidetector CT imaging of the abdomen and pelvis was performed following the standard protocol during bolus administration of intravenous contrast.  Contrast: 80mL OMNIPAQUE IOHEXOL 300 MG/ML  SOLN  Comparison: None.  Findings:  BODY WALL: Unremarkable.  LOWER CHEST:  Mediastinum:  Moderate hiatal hernia with non clearance or reflux of enteric contrast.  Distal esophageal thickening likely from reflux esophagitis.  Lungs/pleura: No consolidation.There are numerous small peripheral pulmonary nodules, some which are calcified.  Most are 5 mm or smaller.  ABDOMEN/PELVIS:  Liver: No focal abnormality.  Biliary: No evidence of biliary obstruction or stone.  Pancreas: Unremarkable.  Spleen: Unremarkable.  Adrenals: Bilateral adrenal thickening without focal nodule.  Kidneys and ureters: Punctate stone in the lower pole right kidney. Tiny cortical low attenuation foci, likely cysts.  Bladder: Unremarkable.  Bowel: Dilated and fluid-filled proximal small bowel, measuring up to 3.8 cm.  There is an abrupt transition point in the low left pelvis.  The segment of bowel beyond the transition point shows marked mural thickening and poor mucosal enhancement.  The distal bowel is decompressed.  Extensive colonic diverticulosis.  No evidence of pneumatosis or free air.  No evidence of large vessel occlusion, although limited for this purpose.  Retroperitoneum: No mass or adenopathy.  Peritoneum: Small-volume ascites.  No free air.  Reproductive: Hysterectomy.  Vascular: Extensive atherosclerotic calcification. 1 cm distal splenic artery aneurysm.  OSSEOUS: No acute abnormalities. No suspicious lytic or blastic lesions.  Critical Value/emergent results were called by telephone at the time of interpretation on 10/13/2012 at 05:10 to Dr Preston Fleeting, who verbally acknowledged these results.  IMPRESSION:  1.  High-grade proximal small bowel obstruction with transition in the left pelvis, likely  from adhesions.  The bowel just beyond the transition point is edematous and possibly ischemic. 2. Numerous small pulmonary nodules. Remote granulomatous infection or neoplastic disease are the primary considerations.  After convalescence, recommend chest CT.  3. Hiatal hernia with reflux or poor distal esophageal clearance.  4.  1 cm splenic artery aneurysm.   Original Report Authenticated By: Tiburcio Pea   Dg Abd Acute W/chest  10/13/2012   *RADIOLOGY REPORT*  Clinical Data: 79 year old female with abdominal and pelvic pain and vomiting.  ACUTE ABDOMEN SERIES (ABDOMEN 2 VIEW & CHEST 1 VIEW)  Comparison: 07/04/2012 and prior chest radiographs dating back to 05/20/2010.  Findings: The cardiomediastinal silhouette is unremarkable. There is no evidence of airspace disease, pleural effusion, consolidation, or pulmonary mass.  No pneumothorax identified.  The bowel gas pattern is unremarkable. There is no evidence of bowel obstruction or pneumoperitoneum. No acute or suspicious bony abnormalities are noted. No suspicious calcifications are identified.  IMPRESSION: No evidence of acute abnormality.  Unremarkable bowel gas pattern.   Original Report Authenticated By: Harmon Pier, M.D.     1. Small bowel obstruction   2. Urinary tract infection   3. Hyponatremia   4. Prerenal azotemia     MDM  Abdominal pain with nausea and vomiting worrisome for possible bowel obstruction. Laboratory workup as well as ECG and abdominal x-rays have been ordered.  Abdominal x-rays were unremarkable the patient did not get any improvement with IV fluids and IV ondansetron. Lactic acid  level is normal as is WBC but left shift is noted. Urinalysis shows evidence of UTI and she's given a dose of ceftriaxone. She's given a dose of metoclopramide and diphenhydramine for her persistent nausea and she is sent for CT of abdomen and pelvis.  CT scan shows evidence of small bowel obstruction. A radiologist was concerned about  possibility of ischemic bowel in spite of normal lactic acid level. Case is discussed with Dr. Luisa Hart of [facility] who has reviewed the scan and does not feel that it shows evidence of ischemic bowel. Case is discussed with Dr. Allena Katz of [facility] who agrees to admit the patient.  Dione Booze, MD 10/13/12 [phone]

## 2012-10-13 NOTE — Progress Notes (Signed)
79yo female c/o N/V/abdominal pain, admitted for SBO, to begin IV ABX for UTI/cystitis.  Will begin Rocephin 1g IV Q24H and monitor.  Vernard Gambles, PharmD, BCPS 10/13/2012 6:46 AM

## 2012-10-13 NOTE — ED Notes (Signed)
Patient vomited while drinking her second cup of contrast.  Drank approx 1/2 of the second cup

## 2012-10-13 NOTE — Consult Note (Signed)
Reason for Consult:SBO Referring Physician: Dr Hildred Priest is an 79 y.o. female.  HPI: Asked to see pt at request of Dr Preston Fleeting for 1 day history of nausea ,  Vomiting and diffuse abdominal pain.  Pain is crampy in nature located in abdomen and moderate in intensity.  Episodic.  Nothing makes it better or worse.  Last BM 2 days ago.  CT shows SBO.    Past Medical History  Diagnosis Date  . Hypertension     History reviewed. No pertinent past surgical history.  History reviewed. No pertinent family history.  Social History:  reports that she has never smoked. She does not have any smokeless tobacco history on file. Her alcohol and drug histories are not on file.  Allergies:  Allergies  Allergen Reactions  . Codeine Other (See Comments)    unknown    Medications: I have reviewed the patient's current medications. Prior to Admission:  (Not in a hospital admission)  Results for orders placed during the hospital encounter of 10/12/12 (from the past 48 hour(s))  CBC WITH DIFFERENTIAL     Status: Abnormal   Collection Time    10/13/12 12:30 AM      Result Value Range   WBC 8.3  4.0 - 10.5 K/uL   Comment: WHITE COUNT CONFIRMED ON SMEAR   RBC 5.43 (*) 3.87 - 5.11 MIL/uL   Hemoglobin 16.1 (*) 12.0 - 15.0 g/dL   HCT 45.4  09.8 - 11.9 %   MCV 82.0  78.0 - 100.0 fL   MCH 29.7  26.0 - 34.0 pg   MCHC 36.2 (*) 30.0 - 36.0 g/dL   RDW 14.7  82.9 - 56.2 %   Platelets 89 (*) 150 - 400 K/uL   Comment: PLATELET COUNT CONFIRMED BY SMEAR     SPECIMEN CHECKED FOR CLOTS     REPEATED TO VERIFY   Neutrophils Relative % 87 (*) 43 - 77 %   Lymphocytes Relative 8 (*) 12 - 46 %   Monocytes Relative 5  3 - 12 %   Eosinophils Relative 0  0 - 5 %   Basophils Relative 0  0 - 1 %   Neutro Abs 7.2  1.7 - 7.7 K/uL   Lymphs Abs 0.7  0.7 - 4.0 K/uL   Monocytes Absolute 0.4  0.1 - 1.0 K/uL   Eosinophils Absolute 0.0  0.0 - 0.7 K/uL   Basophils Absolute 0.0  0.0 - 0.1 K/uL   Smear Review  PLATELETS APPEAR DECREASED     Comment: LARGE PLATELETS PRESENT  COMPREHENSIVE METABOLIC PANEL     Status: Abnormal   Collection Time    10/13/12 12:30 AM      Result Value Range   Sodium 134 (*) 135 - 145 mEq/L   Potassium 4.1  3.5 - 5.1 mEq/L   Chloride 93 (*) 96 - 112 mEq/L   CO2 31  19 - 32 mEq/L   Glucose, Bld 125 (*) 70 - 99 mg/dL   BUN 25 (*) 6 - 23 mg/dL   Creatinine, Ser 1.30  0.50 - 1.10 mg/dL   Calcium 9.8  8.4 - 86.5 mg/dL   Total Protein 7.4  6.0 - 8.3 g/dL   Albumin 3.7  3.5 - 5.2 g/dL   AST 19  0 - 37 U/L   ALT 15  0 - 35 U/L   Alkaline Phosphatase 67  39 - 117 U/L   Total Bilirubin 0.6  0.3 - 1.2 mg/dL  GFR calc non Af Amer 58 (*) >90 mL/min   GFR calc Af Amer 67 (*) >90 mL/min   Comment:            The eGFR has been calculated     using the CKD EPI equation.     This calculation has not been     validated in all clinical     situations.     eGFR's persistently     <90 mL/min signify     possible Chronic Kidney Disease.  LIPASE, BLOOD     Status: None   Collection Time    10/13/12 12:30 AM      Result Value Range   Lipase 48  11 - 59 U/L  CG4 I-STAT (LACTIC ACID)     Status: None   Collection Time    10/13/12 12:35 AM      Result Value Range   Lactic Acid, Venous 1.56  0.5 - 2.2 mmol/L  URINALYSIS, ROUTINE W REFLEX MICROSCOPIC     Status: Abnormal   Collection Time    10/13/12  1:04 AM      Result Value Range   Color, Urine AMBER (*) YELLOW   Comment: BIOCHEMICALS MAY BE AFFECTED BY COLOR   APPearance CLOUDY (*) CLEAR   Specific Gravity, Urine 1.025  1.005 - 1.030   pH 5.5  5.0 - 8.0   Glucose, UA NEGATIVE  NEGATIVE mg/dL   Hgb urine dipstick TRACE (*) NEGATIVE   Bilirubin Urine SMALL (*) NEGATIVE   Ketones, ur NEGATIVE  NEGATIVE mg/dL   Protein, ur NEGATIVE  NEGATIVE mg/dL   Urobilinogen, UA 1.0  0.0 - 1.0 mg/dL   Nitrite NEGATIVE  NEGATIVE   Leukocytes, UA SMALL (*) NEGATIVE  URINE MICROSCOPIC-ADD ON     Status: Abnormal   Collection Time      10/13/12  1:04 AM      Result Value Range   Squamous Epithelial / LPF FEW (*) RARE   WBC, UA 21-50  <3 WBC/hpf   RBC / HPF 0-2  <3 RBC/hpf   Bacteria, UA RARE  RARE   Urine-Other MUCOUS PRESENT      Ct Abdomen Pelvis W Contrast  10/13/2012   *RADIOLOGY REPORT*  Clinical Data: Emesis.  CT ABDOMEN AND PELVIS WITH CONTRAST  Technique:  Multidetector CT imaging of the abdomen and pelvis was performed following the standard protocol during bolus administration of intravenous contrast.  Contrast: 80mL OMNIPAQUE IOHEXOL 300 MG/ML  SOLN  Comparison: None.  Findings:  BODY WALL: Unremarkable.  LOWER CHEST:  Mediastinum:  Moderate hiatal hernia with non clearance or reflux of enteric contrast.  Distal esophageal thickening likely from reflux esophagitis.  Lungs/pleura: No consolidation.There are numerous small peripheral pulmonary nodules, some which are calcified.  Most are 5 mm or smaller.  ABDOMEN/PELVIS:  Liver: No focal abnormality.  Biliary: No evidence of biliary obstruction or stone.  Pancreas: Unremarkable.  Spleen: Unremarkable.  Adrenals: Bilateral adrenal thickening without focal nodule.  Kidneys and ureters: Punctate stone in the lower pole right kidney. Tiny cortical low attenuation foci, likely cysts.  Bladder: Unremarkable.  Bowel: Dilated and fluid-filled proximal small bowel, measuring up to 3.8 cm.  There is an abrupt transition point in the low left pelvis.  The segment of bowel beyond the transition point shows marked mural thickening and poor mucosal enhancement.  The distal bowel is decompressed.  Extensive colonic diverticulosis.  No evidence of pneumatosis or free air.  No evidence of large vessel  occlusion, although limited for this purpose.  Retroperitoneum: No mass or adenopathy.  Peritoneum: Small-volume ascites.  No free air.  Reproductive: Hysterectomy.  Vascular: Extensive atherosclerotic calcification. 1 cm distal splenic artery aneurysm.  OSSEOUS: No acute abnormalities. No  suspicious lytic or blastic lesions.  Critical Value/emergent results were called by telephone at the time of interpretation on 10/13/2012 at 05:10 to Dr Preston Fleeting, who verbally acknowledged these results.  IMPRESSION:  1.  High-grade proximal small bowel obstruction with transition in the left pelvis, likely from adhesions.  The bowel just beyond the transition point is edematous and possibly ischemic. 2. Numerous small pulmonary nodules. Remote granulomatous infection or neoplastic disease are the primary considerations.  After convalescence, recommend chest CT.  3. Hiatal hernia with reflux or poor distal esophageal clearance.  4.  1 cm splenic artery aneurysm.   Original Report Authenticated By: Tiburcio Pea   Dg Abd Acute W/chest  10/13/2012   *RADIOLOGY REPORT*  Clinical Data: 79 year old female with abdominal and pelvic pain and vomiting.  ACUTE ABDOMEN SERIES (ABDOMEN 2 VIEW & CHEST 1 VIEW)  Comparison: 07/04/2012 and prior chest radiographs dating back to 05/20/2010.  Findings: The cardiomediastinal silhouette is unremarkable. There is no evidence of airspace disease, pleural effusion, consolidation, or pulmonary mass.  No pneumothorax identified.  The bowel gas pattern is unremarkable. There is no evidence of bowel obstruction or pneumoperitoneum. No acute or suspicious bony abnormalities are noted. No suspicious calcifications are identified.  IMPRESSION: No evidence of acute abnormality.  Unremarkable bowel gas pattern.   Original Report Authenticated By: Harmon Pier, M.D.    Review of Systems  Constitutional: Positive for weight loss.  HENT: Negative.   Eyes: Positive for blurred vision.  Respiratory: Negative.   Cardiovascular: Negative.   Gastrointestinal: Positive for nausea, vomiting and abdominal pain.  Genitourinary: Negative.   Musculoskeletal: Positive for joint pain. Negative for myalgias.  Skin: Negative.   Neurological: Negative.   Endo/Heme/Allergies: Bruises/bleeds easily.    Psychiatric/Behavioral: Positive for memory loss. The patient is nervous/anxious.    Blood pressure 150/72, pulse 96, temperature 97.4 F (36.3 C), temperature source Oral, resp. rate 13, SpO2 96.00%. Physical Exam  Constitutional: She is oriented to person, place, and time. She appears well-developed.  HENT:  Head: Normocephalic and atraumatic.  Eyes: EOM are normal. Pupils are equal, round, and reactive to light. No scleral icterus.  Neck: Normal range of motion. Neck supple. No tracheal deviation present.  Cardiovascular: Normal rate and regular rhythm.   Respiratory: Effort normal.  GI: Soft. Bowel sounds are normal. She exhibits distension. She exhibits no mass. There is no tenderness. There is no rebound and no guarding.    Musculoskeletal: Normal range of motion.  Neurological: She is alert and oriented to person, place, and time.  Skin: Skin is warm and dry.  Psychiatric: Her speech is rapid and/or pressured. Cognition and memory are impaired.    Assessment/Plan: SBO Normal lactate and WBC No peritonitis or tenderness on exam Doubt intestinal ischemia by exam and lab work.  Recommend NPO/NGT/IVF/repeat films and labs in AM.  Will follow with medicine.  Jerimiah Wolman A. 10/13/2012, 6:20 AM

## 2012-10-13 NOTE — ED Notes (Signed)
Patient transported to X-ray 

## 2012-10-13 NOTE — H&P (Signed)
[facility] History and Physical  Amy Ward  AVW:[id number]  DOB: 1922/02/16  DOA: 10/12/2012  Referring physician: Dr Preston Fleeting PCP: Darnelle Bos, MD  Specialists: DR CORNETT  Chief Complaint: Nausea vomiting and abdominal pain.  HPI: Amy Ward is a 79 y.o. female with Past medical history of hypertension. Patient presented to the ER with the complaint of sudden onset of nausea and vomiting associated with diffuse abdominal pain that started yesterday. Patient mentions the pain was sharp and severe. Associated with nausea. Since yesterday she has she episodes of vomiting and also had one episode in ER. She mentions she has been constipated since last 3 days at least. She also mentions she is not passing any gas. She mentions that she has generally having issues with constipation and takes stool softener on a daily basis. She denies any similar episode in the past, denies any major surgery of the abdomen other than hysterectomy. She mentions pain initially started in the epigastric region and then spread throughout the abdomen. She felt feverish but has not measured her temperature. She also denies any burning urination but mentioned that she had increased frequency without any blood. She denies any cough, shortness of breath, palpitation, focal neurological deficit.  Review of Systems: as mentioned in the history of present illness.  A Comprehensive review of the other systems is negative.  Past Medical History  Diagnosis Date  . Hypertension    History reviewed. No pertinent past surgical history. Social History:  reports that she has never smoked. She does not have any smokeless tobacco history on file. Her alcohol and drug histories are not on file. Patient is coming from home. Patient can participate in ADLs.  Allergies  Allergen Reactions  . Codeine Other (See Comments)    unknown    History reviewed. No pertinent family history.  Prior to Admission  medications   Medication Sig Start Date End Date Taking? Authorizing Provider  Calcium Citrate-Vitamin D (CALCIUM CITRATE + D) 315-250 MG-UNIT TABS Take 1 tablet by mouth 2 (two) times daily.    Yes Historical Provider, MD  Cholecalciferol (VITAMIN D-3) 1000 UNITS CAPS Take 1,000 Units by mouth daily.   Yes Historical Provider, MD  metoprolol tartrate (LOPRESSOR) 25 MG tablet Take 12.5 mg by mouth 3 (three) times daily.    Yes Historical Provider, MD  Multiple Vitamin (MULTIVITAMIN WITH MINERALS) TABS Take 1 tablet by mouth daily.   Yes Historical Provider, MD    Physical Exam: Filed Vitals:   10/13/12 0030 10/13/12 0130 10/13/12 0200 10/13/12 0230  BP: 137/74 150/59 140/62 150/72  Pulse: 117 93 102 96  Temp:      TempSrc:      Resp: 13     SpO2: 98% 93% 95% 96%    General: Alert, Awake and Oriented to Time, Place and Person. Appear in mild distress Eyes: PERRL ENT: Oral Mucosa clear dry. Neck: no JVD, no Carotid Bruits,  Cardiovascular: S1 and S2 Present, systolic murmur present on the apical area, Peripheral Pulses Present Respiratory: Bilateral Air entry equal and Decreased, Clear to Auscultation,  Abdomen: Bowel Sound Present, Soft and diffusely minimally tender, without any guarding or rigidity Skin: No Rash Extremities: No Pedal edema, no calf tenderness Neurologic: Grossly Unremarkable.  Labs on Admission:  Basic Metabolic Panel:  Recent Labs Lab 10/13/12 0030  NA 134*  K 4.1  CL 93*  CO2 31  GLUCOSE 125*  BUN 25*  CREATININE 0.86  CALCIUM 9.8   Liver Function Tests:  Recent Labs Lab 10/13/12 0030  AST 19  ALT 15  ALKPHOS 67  BILITOT 0.6  PROT 7.4  ALBUMIN 3.7    Recent Labs Lab 10/13/12 0030  LIPASE 48   No results found for this basename: AMMONIA,  in the last 168 hours CBC:  Recent Labs Lab 10/13/12 0030  WBC 8.3  NEUTROABS 7.2  HGB 16.1*  HCT 44.5  MCV 82.0  PLT 89*   Cardiac Enzymes: No results found for this basename: CKTOTAL,  CKMB, CKMBINDEX, TROPONINI,  in the last 168 hours  BNP (last 3 results) No results found for this basename: PROBNP,  in the last 8760 hours CBG: No results found for this basename: GLUCAP,  in the last 168 hours  Radiological Exams on Admission: Ct Abdomen Pelvis W Contrast  10/13/2012   *RADIOLOGY REPORT*  Clinical Data: Emesis.  CT ABDOMEN AND PELVIS WITH CONTRAST  Technique:  Multidetector CT imaging of the abdomen and pelvis was performed following the standard protocol during bolus administration of intravenous contrast.  Contrast: 80mL OMNIPAQUE IOHEXOL 300 MG/ML  SOLN  Comparison: None.  Findings:  BODY WALL: Unremarkable.  LOWER CHEST:  Mediastinum:  Moderate hiatal hernia with non clearance or reflux of enteric contrast.  Distal esophageal thickening likely from reflux esophagitis.  Lungs/pleura: No consolidation.There are numerous small peripheral pulmonary nodules, some which are calcified.  Most are 5 mm or smaller.  ABDOMEN/PELVIS:  Liver: No focal abnormality.  Biliary: No evidence of biliary obstruction or stone.  Pancreas: Unremarkable.  Spleen: Unremarkable.  Adrenals: Bilateral adrenal thickening without focal nodule.  Kidneys and ureters: Punctate stone in the lower pole right kidney. Tiny cortical low attenuation foci, likely cysts.  Bladder: Unremarkable.  Bowel: Dilated and fluid-filled proximal small bowel, measuring up to 3.8 cm.  There is an abrupt transition point in the low left pelvis.  The segment of bowel beyond the transition point shows marked mural thickening and poor mucosal enhancement.  The distal bowel is decompressed.  Extensive colonic diverticulosis.  No evidence of pneumatosis or free air.  No evidence of large vessel occlusion, although limited for this purpose.  Retroperitoneum: No mass or adenopathy.  Peritoneum: Small-volume ascites.  No free air.  Reproductive: Hysterectomy.  Vascular: Extensive atherosclerotic calcification. 1 cm distal splenic artery aneurysm.   OSSEOUS: No acute abnormalities. No suspicious lytic or blastic lesions.  Critical Value/emergent results were called by telephone at the time of interpretation on 10/13/2012 at 05:10 to Dr Preston Fleeting, who verbally acknowledged these results.  IMPRESSION:  1.  High-grade proximal small bowel obstruction with transition in the left pelvis, likely from adhesions.  The bowel just beyond the transition point is edematous and possibly ischemic. 2. Numerous small pulmonary nodules. Remote granulomatous infection or neoplastic disease are the primary considerations.  After convalescence, recommend chest CT.  3. Hiatal hernia with reflux or poor distal esophageal clearance.  4.  1 cm splenic artery aneurysm.   Original Report Authenticated By: Tiburcio Pea   Dg Abd Acute W/chest  10/13/2012   *RADIOLOGY REPORT*  Clinical Data: 79 year old female with abdominal and pelvic pain and vomiting.  ACUTE ABDOMEN SERIES (ABDOMEN 2 VIEW & CHEST 1 VIEW)  Comparison: 07/04/2012 and prior chest radiographs dating back to 05/20/2010.  Findings: The cardiomediastinal silhouette is unremarkable. There is no evidence of airspace disease, pleural effusion, consolidation, or pulmonary mass.  No pneumothorax identified.  The bowel gas pattern is unremarkable. There is no evidence of bowel obstruction or pneumoperitoneum. No acute or suspicious  bony abnormalities are noted. No suspicious calcifications are identified.  IMPRESSION: No evidence of acute abnormality.  Unremarkable bowel gas pattern.   Original Report Authenticated By: Harmon Pier, M.D.   Assessment/Plan Principal Problem:   Small bowel obstruction Active Problems:   Hypertension   UTI (lower urinary tract infection)   Pulmonary nodule   1.  small bowel obstruction CT scan of the abdomen and pelvis is suggestive of small bowel obstruction. CT scan also reads about possible ischemia, but she has no signs of systemic inflammatory response syndrome, lactic acid is normal,  and her bowel examination is not alarming.  She appears to be improving in terms of her nausea and vomiting. Considering this on call general surgery team recommends conservative management. NG tube will be inserted for decompression. Bowel rest with n.p.o.. IV fluids will be given gentle hydration.  I.'s and O. will be monitored. The patient's condition will be getting worse with the next 24-48 hours decision for the surgery will be taken by surgical team.  2. Hypertension Holding her antihypertensive at present  3. UTI The patient did mention about increasing frequency of urination, presented with abdominal pain and urine analysis is suggestive of a UTI. She is given ceftriaxone in the ER already. We will continue for 3 days of treatment for possible cystitis.  DVT Prophylaxis: subcutaneous Heparin Nutrition: npo for bowel rest  Code Status: DNR/DNI. Pt's son is power of attorney and would like to change it to a full code if she has to go for surgery. Family Communication: Patient's son was at the bedside and has been explained about the plan. Son is living for vacation in [place] and would be available on phone.   Author: Lynden Oxford, MD [facility] Pager: [phone] 10/13/2012, 6:09 AM    If 7PM-7AM, please contact night-coverage [email or web address] Password [email or web address]

## 2012-10-13 NOTE — ED Notes (Signed)
MD at bedside. 

## 2012-10-13 NOTE — ED Notes (Signed)
In and out cath done for UA.  Pt tolerated well

## 2012-10-13 NOTE — ED Notes (Signed)
Attempted times 2 unsuccessfully to insert NG

## 2012-10-13 NOTE — ED Notes (Signed)
Drinking second cup of contrast

## 2012-10-14 ENCOUNTER — Inpatient Hospital Stay (HOSPITAL_COMMUNITY): Payer: Medicare Other

## 2012-10-14 LAB — COMPREHENSIVE METABOLIC PANEL
ALT: 12 U/L (ref 0–35)
AST: 20 U/L (ref 0–37)
Calcium: 9.4 mg/dL (ref 8.4–10.5)
GFR calc Af Amer: 86 mL/min — ABNORMAL LOW (ref >90)
Sodium: 133 mEq/L — ABNORMAL LOW (ref 135–145)
Total Protein: 6.7 g/dL (ref 6.0–8.3)

## 2012-10-14 LAB — CBC
MCH: 29 pg (ref 26.0–34.0)
MCHC: 35 g/dL (ref 30.0–36.0)
Platelets: 76 10*3/uL — ABNORMAL LOW (ref 150–400)

## 2012-10-14 LAB — GLUCOSE, CAPILLARY: Glucose-Capillary: 101 mg/dL — ABNORMAL HIGH (ref 70–99)

## 2012-10-14 MED ORDER — BISACODYL 10 MG RE SUPP
10.0000 mg | Freq: Once | RECTAL | Status: AC
  Administered 2012-10-14: 10 mg via RECTAL
  Filled 2012-10-14: qty 1

## 2012-10-14 NOTE — Progress Notes (Signed)
Subjective: No BM or flatus but still mildly confused.   Objective: Vital signs in last 24 hours: Temp:  [98.5 F (36.9 C)-98.9 F (37.2 C)] 98.5 F (36.9 C) (08/03 0547) Pulse Rate:  [93-100] 100 (08/03 0547) Resp:  [18-20] 20 (08/03 0547) BP: (142-156)/(72-90) 142/82 mmHg (08/03 0547) SpO2:  [93 %-95 %] 93 % (08/03 0547) Last BM Date: 10/11/12  Intake/Output from previous day: 08/02 0701 - 08/03 0700 In: 500 [I.V.:500] Out: -  Intake/Output this shift: Total I/O In: 0  Out: 250 [Emesis/NG output:250]  GI: not very tender mild distension  no BS no rebound or guarding.  Lab Results:   Recent Labs  10/13/12 0030  WBC 8.3  HGB 16.1*  HCT 44.5  PLT 89*   BMET  Recent Labs  10/13/12 0030  NA 134*  K 4.1  CL 93*  CO2 31  GLUCOSE 125*  BUN 25*  CREATININE 0.86  CALCIUM 9.8   PT/INR No results found for this basename: LABPROT, INR,  in the last 72 hours ABG No results found for this basename: PHART, PCO2, PO2, HCO3,  in the last 72 hours  Studies/Results: Ct Abdomen Pelvis W Contrast  10/13/2012   *RADIOLOGY REPORT*  Clinical Data: Emesis.  CT ABDOMEN AND PELVIS WITH CONTRAST  Technique:  Multidetector CT imaging of the abdomen and pelvis was performed following the standard protocol during bolus administration of intravenous contrast.  Contrast: 80mL OMNIPAQUE IOHEXOL 300 MG/ML  SOLN  Comparison: None.  Findings:  BODY WALL: Unremarkable.  LOWER CHEST:  Mediastinum:  Moderate hiatal hernia with non clearance or reflux of enteric contrast.  Distal esophageal thickening likely from reflux esophagitis.  Lungs/pleura: No consolidation.There are numerous small peripheral pulmonary nodules, some which are calcified.  Most are 5 mm or smaller.  ABDOMEN/PELVIS:  Liver: No focal abnormality.  Biliary: No evidence of biliary obstruction or stone.  Pancreas: Unremarkable.  Spleen: Unremarkable.  Adrenals: Bilateral adrenal thickening without focal nodule.  Kidneys and  ureters: Punctate stone in the lower pole right kidney. Tiny cortical low attenuation foci, likely cysts.  Bladder: Unremarkable.  Bowel: Dilated and fluid-filled proximal small bowel, measuring up to 3.8 cm.  There is an abrupt transition point in the low left pelvis.  The segment of bowel beyond the transition point shows marked mural thickening and poor mucosal enhancement.  The distal bowel is decompressed.  Extensive colonic diverticulosis.  No evidence of pneumatosis or free air.  No evidence of large vessel occlusion, although limited for this purpose.  Retroperitoneum: No mass or adenopathy.  Peritoneum: Small-volume ascites.  No free air.  Reproductive: Hysterectomy.  Vascular: Extensive atherosclerotic calcification. 1 cm distal splenic artery aneurysm.  OSSEOUS: No acute abnormalities. No suspicious lytic or blastic lesions.  Critical Value/emergent results were called by telephone at the time of interpretation on 10/13/2012 at 05:10 to Dr Preston Fleeting, who verbally acknowledged these results.  IMPRESSION:  1.  High-grade proximal small bowel obstruction with transition in the left pelvis, likely from adhesions.  The bowel just beyond the transition point is edematous and possibly ischemic. 2. Numerous small pulmonary nodules. Remote granulomatous infection or neoplastic disease are the primary considerations.  After convalescence, recommend chest CT.  3. Hiatal hernia with reflux or poor distal esophageal clearance.  4.  1 cm splenic artery aneurysm.   Original Report Authenticated By: Tiburcio Pea   Dg Abd Acute W/chest  10/13/2012   *RADIOLOGY REPORT*  Clinical Data: 79 year old female with abdominal and pelvic pain and vomiting.  ACUTE ABDOMEN SERIES (ABDOMEN 2 VIEW & CHEST 1 VIEW)  Comparison: 07/04/2012 and prior chest radiographs dating back to 05/20/2010.  Findings: The cardiomediastinal silhouette is unremarkable. There is no evidence of airspace disease, pleural effusion, consolidation, or  pulmonary mass.  No pneumothorax identified.  The bowel gas pattern is unremarkable. There is no evidence of bowel obstruction or pneumoperitoneum. No acute or suspicious bony abnormalities are noted. No suspicious calcifications are identified.  IMPRESSION: No evidence of acute abnormality.  Unremarkable bowel gas pattern.   Original Report Authenticated By: Harmon Pier, M.D.    Anti-infectives: Anti-infectives   Start     Dose/Rate Route Frequency Ordered Stop   10/13/12 2200  cefTRIAXone (ROCEPHIN) 1 g in dextrose 5 % 50 mL IVPB     1 g 100 mL/hr over 30 Minutes Intravenous Every 24 hours 10/13/12 0647     10/13/12 0200  cefTRIAXone (ROCEPHIN) 1 g in dextrose 5 % 50 mL IVPB     1 g 100 mL/hr over 30 Minutes Intravenous  Once 10/13/12 0153 10/13/12 0307      Assessment/Plan: pSBO vs SBO Check films and labs.  Not vomiting much but hard to gauge from pt due to confusion.  According to nurse only 2 episodes of emesis over 24 hours.  Unable to place NGT.  Try suppositories and enema for now.  Long standing history of weight loss of unknown significance.    LOS: 2 days    Amy Comes A. 10/14/2012

## 2012-10-14 NOTE — Progress Notes (Signed)
Subjective: Patient had emesis this morning. Currently sitting in chair, appears comfortable. No other problems per nursing Objective: Vital signs in last 24 hours: Temp:  [98.5 F (36.9 C)-98.9 F (37.2 C)] 98.5 F (36.9 C) (08/03 0547) Pulse Rate:  [93-100] 100 (08/03 0547) Resp:  [18-20] 20 (08/03 0547) BP: (142-156)/(72-90) 142/82 mmHg (08/03 0547) SpO2:  [93 %-95 %] 93 % (08/03 0547) Weight change:  Last BM Date: 10/11/12  Intake/Output from previous day: 08/02 0701 - 08/03 0700 In: 500 [I.V.:500] Out: -  Intake/Output this shift: Total I/O In: 0  Out: 250 [Emesis/NG output:250]  General appearance: alert and cooperative Resp: clear to auscultation bilaterally Cardio: regular rate and rhythm, S1, S2 normal, no murmur, click, rub or gallop GI: Patient examined while sitting in chair, abdomen slightly protuberant, decreased bowel sounds, no pain with palpation Extremities: extremities normal, atraumatic, no cyanosis or edema  Lab Results:  Results for orders placed during the hospital encounter of 10/12/12 (from the past 24 hour(s))  GLUCOSE, CAPILLARY     Status: Abnormal   Collection Time    10/14/12  8:43 AM      Result Value Range   Glucose-Capillary 101 (*) 70 - 99 mg/dL   Comment 1 Documented in Chart     Comment 2 Notify RN    CBC     Status: Abnormal   Collection Time    10/14/12 10:00 AM      Result Value Range   WBC 7.0  4.0 - 10.5 K/uL   RBC 5.80 (*) 3.87 - 5.11 MIL/uL   Hemoglobin 16.8 (*) 12.0 - 15.0 g/dL   HCT 14.7 (*) 82.9 - 56.2 %   MCV 82.8  78.0 - 100.0 fL   MCH 29.0  26.0 - 34.0 pg   MCHC 35.0  30.0 - 36.0 g/dL   RDW 13.0  86.5 - 78.4 %   Platelets 76 (*) 150 - 400 K/uL  COMPREHENSIVE METABOLIC PANEL     Status: Abnormal   Collection Time    10/14/12 10:00 AM      Result Value Range   Sodium 133 (*) 135 - 145 mEq/L   Potassium 3.6  3.5 - 5.1 mEq/L   Chloride 95 (*) 96 - 112 mEq/L   CO2 25  19 - 32 mEq/L   Glucose, Bld 109 (*) 70 - 99  mg/dL   BUN 19  6 - 23 mg/dL   Creatinine, Ser 6.96  0.50 - 1.10 mg/dL   Calcium 9.4  8.4 - 29.5 mg/dL   Total Protein 6.7  6.0 - 8.3 g/dL   Albumin 3.4 (*) 3.5 - 5.2 g/dL   AST 20  0 - 37 U/L   ALT 12  0 - 35 U/L   Alkaline Phosphatase 58  39 - 117 U/L   Total Bilirubin 0.6  0.3 - 1.2 mg/dL   GFR calc non Af Amer 74 (*) >90 mL/min   GFR calc Af Amer 86 (*) >90 mL/min      Studies/Results: Ct Abdomen Pelvis W Contrast  10/13/2012   *RADIOLOGY REPORT*  Clinical Data: Emesis.  CT ABDOMEN AND PELVIS WITH CONTRAST  Technique:  Multidetector CT imaging of the abdomen and pelvis was performed following the standard protocol during bolus administration of intravenous contrast.  Contrast: 80mL OMNIPAQUE IOHEXOL 300 MG/ML  SOLN  Comparison: None.  Findings:  BODY WALL: Unremarkable.  LOWER CHEST:  Mediastinum:  Moderate hiatal hernia with non clearance or reflux of enteric contrast.  Distal esophageal thickening likely from reflux esophagitis.  Lungs/pleura: No consolidation.There are numerous small peripheral pulmonary nodules, some which are calcified.  Most are 5 mm or smaller.  ABDOMEN/PELVIS:  Liver: No focal abnormality.  Biliary: No evidence of biliary obstruction or stone.  Pancreas: Unremarkable.  Spleen: Unremarkable.  Adrenals: Bilateral adrenal thickening without focal nodule.  Kidneys and ureters: Punctate stone in the lower pole right kidney. Tiny cortical low attenuation foci, likely cysts.  Bladder: Unremarkable.  Bowel: Dilated and fluid-filled proximal small bowel, measuring up to 3.8 cm.  There is an abrupt transition point in the low left pelvis.  The segment of bowel beyond the transition point shows marked mural thickening and poor mucosal enhancement.  The distal bowel is decompressed.  Extensive colonic diverticulosis.  No evidence of pneumatosis or free air.  No evidence of large vessel occlusion, although limited for this purpose.  Retroperitoneum: No mass or adenopathy.   Peritoneum: Small-volume ascites.  No free air.  Reproductive: Hysterectomy.  Vascular: Extensive atherosclerotic calcification. 1 cm distal splenic artery aneurysm.  OSSEOUS: No acute abnormalities. No suspicious lytic or blastic lesions.  Critical Value/emergent results were called by telephone at the time of interpretation on 10/13/2012 at 05:10 to Dr Preston Fleeting, who verbally acknowledged these results.  IMPRESSION:  1.  High-grade proximal small bowel obstruction with transition in the left pelvis, likely from adhesions.  The bowel just beyond the transition point is edematous and possibly ischemic. 2. Numerous small pulmonary nodules. Remote granulomatous infection or neoplastic disease are the primary considerations.  After convalescence, recommend chest CT.  3. Hiatal hernia with reflux or poor distal esophageal clearance.  4.  1 cm splenic artery aneurysm.   Original Report Authenticated By: Tiburcio Pea   Dg Abd Acute W/chest  10/13/2012   *RADIOLOGY REPORT*  Clinical Data: 79 year old female with abdominal and pelvic pain and vomiting.  ACUTE ABDOMEN SERIES (ABDOMEN 2 VIEW & CHEST 1 VIEW)  Comparison: 07/04/2012 and prior chest radiographs dating back to 05/20/2010.  Findings: The cardiomediastinal silhouette is unremarkable. There is no evidence of airspace disease, pleural effusion, consolidation, or pulmonary mass.  No pneumothorax identified.  The bowel gas pattern is unremarkable. There is no evidence of bowel obstruction or pneumoperitoneum. No acute or suspicious bony abnormalities are noted. No suspicious calcifications are identified.  IMPRESSION: No evidence of acute abnormality.  Unremarkable bowel gas pattern.   Original Report Authenticated By: Harmon Pier, M.D.    Medications:  Prior to Admission:  Prescriptions prior to admission  Medication Sig Dispense Refill  . Calcium Citrate-Vitamin D (CALCIUM CITRATE + D) 315-250 MG-UNIT TABS Take 1 tablet by mouth 2 (two) times daily.       .  Cholecalciferol (VITAMIN D-3) 1000 UNITS CAPS Take 1,000 Units by mouth daily.      . metoprolol tartrate (LOPRESSOR) 25 MG tablet Take 12.5 mg by mouth 3 (three) times daily.       . Multiple Vitamin (MULTIVITAMIN WITH MINERALS) TABS Take 1 tablet by mouth daily.       Scheduled: . bisacodyl  10 mg Rectal Once  . cefTRIAXone (ROCEPHIN)  IV  1 g Intravenous Q24H  . heparin  5,000 Units Subcutaneous Q8H   Continuous: . sodium chloride    . sodium chloride 75 mL/hr at 10/14/12 0935    Assessment/Plan: Small bowel obstruction, NG tube not placed, currently patient comfortable, followup imaging scheduled. Patient understands surgery may be indicated but ideally would like to avoid. Continue treatment per surgery  History of hypertension   CT noting numerous small pulmonary nodules, followup CT recommended   LOS: 2 days   Tremar Wickens D 10/14/2012, 10:47 AM

## 2012-10-15 ENCOUNTER — Inpatient Hospital Stay (HOSPITAL_COMMUNITY): Payer: Medicare Other

## 2012-10-15 DIAGNOSIS — I10 Essential (primary) hypertension: Secondary | ICD-10-CM

## 2012-10-15 LAB — COMPREHENSIVE METABOLIC PANEL
ALT: 11 U/L (ref 0–35)
AST: 17 U/L (ref 0–37)
Albumin: 3.2 g/dL — ABNORMAL LOW (ref 3.5–5.2)
Alkaline Phosphatase: 52 U/L (ref 39–117)
BUN: 20 mg/dL (ref 6–23)
CO2: 26 mEq/L (ref 19–32)
Calcium: 8.9 mg/dL (ref 8.4–10.5)
Chloride: 100 mEq/L (ref 96–112)
Creatinine, Ser: 0.75 mg/dL (ref 0.50–1.10)
GFR calc Af Amer: 84 mL/min — ABNORMAL LOW (ref >90)
GFR calc non Af Amer: 73 mL/min — ABNORMAL LOW (ref >90)
Glucose, Bld: 85 mg/dL (ref 70–99)
Potassium: 3.9 mEq/L (ref 3.5–5.1)
Sodium: 137 mEq/L (ref 135–145)
Total Bilirubin: 0.6 mg/dL (ref 0.3–1.2)
Total Protein: 6.2 g/dL (ref 6.0–8.3)

## 2012-10-15 LAB — CBC
HCT: 46.9 % — ABNORMAL HIGH (ref 36.0–46.0)
Hemoglobin: 15.9 g/dL — ABNORMAL HIGH (ref 12.0–15.0)
MCH: 28.1 pg (ref 26.0–34.0)
MCHC: 33.9 g/dL (ref 30.0–36.0)
MCV: 83 fL (ref 78.0–100.0)
Platelets: 81 10*3/uL — ABNORMAL LOW (ref 150–400)
RBC: 5.65 MIL/uL — ABNORMAL HIGH (ref 3.87–5.11)
RDW: 14.4 % (ref 11.5–15.5)
WBC: 5 10*3/uL (ref 4.0–10.5)

## 2012-10-15 MED ORDER — METOPROLOL TARTRATE 1 MG/ML IV SOLN
2.5000 mg | Freq: Three times a day (TID) | INTRAVENOUS | Status: DC
  Administered 2012-10-15 – 2012-10-17 (×7): 2.5 mg via INTRAVENOUS
  Administered 2012-10-17: 21:00:00 via INTRAVENOUS
  Administered 2012-10-17 – 2012-10-18 (×4): 2.5 mg via INTRAVENOUS
  Administered 2012-10-19: 25 mg via INTRAVENOUS
  Filled 2012-10-15 (×16): qty 5

## 2012-10-15 NOTE — Progress Notes (Signed)
Her abdomen is soft and she does have some bs, she has no abdominal tenderness, she has ng in now with about 600 cc bilious material out then according to her.  She has not passed flatus or had a bm but contrast does appear to be in her right colon.  I think she now has appropriate treatment for sbo and hopefully will resolve in next 48 hours or so.  I discussed with she and her family indication for operation including worsening or not improving with current therapy

## 2012-10-15 NOTE — Progress Notes (Signed)
UR COMPLETED  

## 2012-10-15 NOTE — Progress Notes (Signed)
Assessment/Plan: Principal Problem:   Small bowel obstruction - she is not opening up any as far as I can tell. Abd is quiet. ?does she have an ischemic/inflammatory process underlying this or in addition to an apparent mechanical obstruction. Await surgery re-eval this morning but I am not sure we can wait a lot longer.  Active Problems:   Weight loss, unintentional - this has been in the last year and has been thoroughly worked up and and reviewed in the office. Most of this is emotional in origin as she has become estranged from her dtr and granddtr. This is very distressing to the patient, and, as a result, she has not been eating.    Hypertension   UTI (lower urinary tract infection) - pyuria on admission. However, no urine culture done. She is now on day #3 of ceftriaxone.    Pulmonary nodule   Subjective: She has some stuff come up in the back of her throat from time to time. Denies abd pain. No flatus. Virtually no results from her enema and suppository  Objective:  Vital Signs: Filed Vitals:   10/13/12 2135 10/14/12 0547 10/14/12 2229 10/15/12 0708  BP: 153/90 142/82 155/81 129/66  Pulse: 93 100 94 115  Temp: 98.6 F (37 C) 98.5 F (36.9 C) 98.6 F (37 C) 98.7 F (37.1 C)  TempSrc: Oral Oral Oral Oral  Resp: 19 20 18 16   SpO2: 95% 93% 94% 95%     EXAM: ABD: soft, quiet. Mild diffuse tenderness.    Intake/Output Summary (Last 24 hours) at 10/15/12 0806 Last data filed at 10/15/12 0606  Gross per 24 hour  Intake    910 ml  Output    250 ml  Net    660 ml    Lab Results:  Recent Labs  10/13/12 0030 10/14/12 1000  NA 134* 133*  K 4.1 3.6  CL 93* 95*  CO2 31 25  GLUCOSE 125* 109*  BUN 25* 19  CREATININE 0.86 0.71  CALCIUM 9.8 9.4    Recent Labs  10/13/12 0030 10/14/12 1000  AST 19 20  ALT 15 12  ALKPHOS 67 58  BILITOT 0.6 0.6  PROT 7.4 6.7  ALBUMIN 3.7 3.4*    Recent Labs  10/13/12 0030  LIPASE 48    Recent Labs  10/13/12 0030  10/14/12 1000  WBC 8.3 7.0  NEUTROABS 7.2  --   HGB 16.1* 16.8*  HCT 44.5 48.0*  MCV 82.0 82.8  PLT 89* 76*   No results found for this basename: CKTOTAL, CKMB, CKMBINDEX, TROPONINI,  in the last 72 hours No components found with this basename: POCBNP,  No results found for this basename: DDIMER,  in the last 72 hours No results found for this basename: HGBA1C,  in the last 72 hours No results found for this basename: CHOL, HDL, LDLCALC, TRIG, CHOLHDL, LDLDIRECT,  in the last 72 hours No results found for this basename: TSH, T4TOTAL, FREET3, T3FREE, THYROIDAB,  in the last 72 hours No results found for this basename: VITAMINB12, FOLATE, FERRITIN, TIBC, IRON, RETICCTPCT,  in the last 72 hours  Studies/Results: Dg Abd Acute W/chest  10/14/2012   *RADIOLOGY REPORT*  Clinical Data: Vomiting  ACUTE ABDOMEN SERIES (ABDOMEN 2 VIEW & CHEST 1 VIEW)  Comparison: CT abdomen pelvis dated 10/13/2012.  Chest/abdomen radiographs dated 10/13/2012.  Findings: Chronic interstitial markings/emphysematous changes. Possible mild left basilar atelectasis.  No pleural effusion or pneumothorax.  The heart is normal in size.  Nonspecific bowel gas  pattern, reflecting multiple fluid filled loops of dilated small bowel when correlating with recent CT, worrisome for small bowel obstruction.  No evidence of free air under the diaphragm on the upright view.  Degenerative changes of the visualized thoracolumbar spine.  IMPRESSION: No evidence of acute cardiopulmonary disease.  Nonspecific bowel gas pattern, corresponding to multiple fluid filled loops of dilated small bowel on CT, worrisome for small bowel obstruction.  No free air.   Original Report Authenticated By: Charline Bills, M.D.   Medications: Medications administered in the last 24 hours reviewed.  Current Medication List reviewed.    LOS: 3 days   [facility] @ [facility] [phone]) 10/15/2012, 8:06 AM

## 2012-10-15 NOTE — Progress Notes (Signed)
Patient ID: Amy Ward, female   DOB: 14-Mar-1922, 79 y.o.   MRN: [id number]    Subjective: Pt is burping.  Not passing flatus or BMs.  Doesn't want to attempt NGT again because it made her nose bleed and someone told her we wouldn't try this anymore.  No abdominal pain.  Objective: Vital signs in last 24 hours: Temp:  [98.6 F (37 C)-98.7 F (37.1 C)] 98.7 F (37.1 C) (08/04 0708) Pulse Rate:  [94-115] 115 (08/04 0708) Resp:  [16-18] 16 (08/04 0708) BP: (129-155)/(66-81) 129/66 mmHg (08/04 0708) SpO2:  [94 %-95 %] 95 % (08/04 0708) Last BM Date: 10/11/12  Intake/Output from previous day: 08/03 0701 - 08/04 0700 In: 910 [I.V.:910] Out: 250 [Emesis/NG output:250] Intake/Output this shift:    PE: Abd: soft, but distended, hypoactive BS, NT Heart: regular Lungs: CTAB  Lab Results:   Recent Labs  10/14/12 1000 10/15/12 0832  WBC 7.0 5.0  HGB 16.8* 15.9*  HCT 48.0* 46.9*  PLT 76* 81*   BMET  Recent Labs  10/13/12 0030 10/14/12 1000  NA 134* 133*  K 4.1 3.6  CL 93* 95*  CO2 31 25  GLUCOSE 125* 109*  BUN 25* 19  CREATININE 0.86 0.71  CALCIUM 9.8 9.4   PT/INR No results found for this basename: LABPROT, INR,  in the last 72 hours CMP     Component Value Date/Time   NA 133* 10/14/2012 1000   K 3.6 10/14/2012 1000   CL 95* 10/14/2012 1000   CO2 25 10/14/2012 1000   GLUCOSE 109* 10/14/2012 1000   BUN 19 10/14/2012 1000   CREATININE 0.71 10/14/2012 1000   CALCIUM 9.4 10/14/2012 1000   PROT 6.7 10/14/2012 1000   ALBUMIN 3.4* 10/14/2012 1000   AST 20 10/14/2012 1000   ALT 12 10/14/2012 1000   ALKPHOS 58 10/14/2012 1000   BILITOT 0.6 10/14/2012 1000   GFRNONAA 74* 10/14/2012 1000   GFRAA 86* 10/14/2012 1000   Lipase     Component Value Date/Time   LIPASE 48 10/13/2012 0030       Studies/Results: Dg Abd Acute W/chest  10/14/2012   *RADIOLOGY REPORT*  Clinical Data: Vomiting  ACUTE ABDOMEN SERIES (ABDOMEN 2 VIEW & CHEST 1 VIEW)  Comparison: CT abdomen pelvis dated 10/13/2012.   Chest/abdomen radiographs dated 10/13/2012.  Findings: Chronic interstitial markings/emphysematous changes. Possible mild left basilar atelectasis.  No pleural effusion or pneumothorax.  The heart is normal in size.  Nonspecific bowel gas pattern, reflecting multiple fluid filled loops of dilated small bowel when correlating with recent CT, worrisome for small bowel obstruction.  No evidence of free air under the diaphragm on the upright view.  Degenerative changes of the visualized thoracolumbar spine.  IMPRESSION: No evidence of acute cardiopulmonary disease.  Nonspecific bowel gas pattern, corresponding to multiple fluid filled loops of dilated small bowel on CT, worrisome for small bowel obstruction.  No free air.   Original Report Authenticated By: Charline Bills, M.D.    Anti-infectives: Anti-infectives   Start     Dose/Rate Route Frequency Ordered Stop   10/13/12 2200  cefTRIAXone (ROCEPHIN) 1 g in dextrose 5 % 50 mL IVPB  Status:  Discontinued     1 g 100 mL/hr over 30 Minutes Intravenous Every 24 hours 10/13/12 0647 10/14/12 1052   10/13/12 0200  cefTRIAXone (ROCEPHIN) 1 g in dextrose 5 % 50 mL IVPB     1 g 100 mL/hr over 30 Minutes Intravenous  Once 10/13/12 0153 10/13/12 0307  Assessment/Plan  1. SBO 2. HTN  Plan: 1. Will order lopressor 2.5mg  TID for cardiac protection in case she needs an operation. 2. The patient would benefit from trying another NGT placement attempt as the patient is older and if we can avoid an operation, would benefit her.  She is agreeable to have radiology try to place an NGT. 3. Repeat films today   LOS: 3 days    Tawan Corkern E 10/15/2012, 9:17 AM Pager: [phone]

## 2012-10-15 NOTE — Progress Notes (Signed)
No emesis during the night except when RN cleansed pt's mouth and she gagged up about 5-10 cc of greenish liquid.

## 2012-10-16 ENCOUNTER — Inpatient Hospital Stay (HOSPITAL_COMMUNITY): Payer: Medicare Other

## 2012-10-16 DIAGNOSIS — D696 Thrombocytopenia, unspecified: Secondary | ICD-10-CM | POA: Diagnosis not present

## 2012-10-16 LAB — CBC
HCT: 47 % — ABNORMAL HIGH (ref 36.0–46.0)
Hemoglobin: 16.5 g/dL — ABNORMAL HIGH (ref 12.0–15.0)
MCH: 29.3 pg (ref 26.0–34.0)
MCHC: 35.1 g/dL (ref 30.0–36.0)
MCV: 83.3 fL (ref 78.0–100.0)
Platelets: 87 10*3/uL — ABNORMAL LOW (ref 150–400)
RBC: 5.64 MIL/uL — ABNORMAL HIGH (ref 3.87–5.11)
RDW: 14.2 % (ref 11.5–15.5)
WBC: 4.8 10*3/uL (ref 4.0–10.5)

## 2012-10-16 LAB — BASIC METABOLIC PANEL
BUN: 23 mg/dL (ref 6–23)
CO2: 27 mEq/L (ref 19–32)
Calcium: 8.5 mg/dL (ref 8.4–10.5)
Chloride: 103 mEq/L (ref 96–112)
Creatinine, Ser: 0.75 mg/dL (ref 0.50–1.10)
GFR calc Af Amer: 84 mL/min — ABNORMAL LOW (ref >90)
GFR calc non Af Amer: 73 mL/min — ABNORMAL LOW (ref >90)
Glucose, Bld: 117 mg/dL — ABNORMAL HIGH (ref 70–99)
Potassium: 2.9 mEq/L — ABNORMAL LOW (ref 3.5–5.1)
Sodium: 141 mEq/L (ref 135–145)

## 2012-10-16 LAB — GLUCOSE, CAPILLARY: Glucose-Capillary: 128 mg/dL — ABNORMAL HIGH (ref 70–99)

## 2012-10-16 MED ORDER — DEXTROSE 50 % IV SOLN
INTRAVENOUS | Status: AC
  Administered 2012-10-16: 50 mL
  Filled 2012-10-16: qty 50

## 2012-10-16 MED ORDER — DEXTROSE 50 % IV SOLN: 25.0000 mL | Freq: Once | INTRAVENOUS | Status: AC | PRN

## 2012-10-16 MED ORDER — BISACODYL 10 MG RE SUPP
10.0000 mg | Freq: Once | RECTAL | Status: AC
  Administered 2012-10-16: 10 mg via RECTAL

## 2012-10-16 NOTE — Progress Notes (Signed)
Patient ID: Amy Ward, female   DOB: 13-Nov-1921, 79 y.o.   MRN: [id number]    Subjective: Pt still mildly confused as she repeated the same stories to me today as she did yesterday.  No flatus or BM she says  Objective: Vital signs in last 24 hours: Temp:  [98.3 F (36.8 C)-98.6 F (37 C)] 98.6 F (37 C) (08/05 0558) Pulse Rate:  [85-93] 90 (08/05 0558) Resp:  [16] 16 (08/05 0558) BP: (145-165)/(65-82) 145/65 mmHg (08/05 0558) SpO2:  [93 %-95 %] 95 % (08/05 0558) Last BM Date: 10/11/12  Intake/Output from previous day: 08/04 0701 - 08/05 0700 In: -  Out: 1400 [Emesis/NG output:1400] Intake/Output this shift:    PE: Abd: softer and less distended than yesterday, +BS, NT, NGT with bilious output  Lab Results:   Recent Labs  10/14/12 1000 10/15/12 0832  WBC 7.0 5.0  HGB 16.8* 15.9*  HCT 48.0* 46.9*  PLT 76* 81*   BMET  Recent Labs  10/14/12 1000 10/15/12 0832  NA 133* 137  K 3.6 3.9  CL 95* 100  CO2 25 26  GLUCOSE 109* 85  BUN 19 20  CREATININE 0.71 0.75  CALCIUM 9.4 8.9   PT/INR No results found for this basename: LABPROT, INR,  in the last 72 hours CMP     Component Value Date/Time   NA 137 10/15/2012 0832   K 3.9 10/15/2012 0832   CL 100 10/15/2012 0832   CO2 26 10/15/2012 0832   GLUCOSE 85 10/15/2012 0832   BUN 20 10/15/2012 0832   CREATININE 0.75 10/15/2012 0832   CALCIUM 8.9 10/15/2012 0832   PROT 6.2 10/15/2012 0832   ALBUMIN 3.2* 10/15/2012 0832   AST 17 10/15/2012 0832   ALT 11 10/15/2012 0832   ALKPHOS 52 10/15/2012 0832   BILITOT 0.6 10/15/2012 0832   GFRNONAA 73* 10/15/2012 0832   GFRAA 84* 10/15/2012 0832   Lipase     Component Value Date/Time   LIPASE 48 10/13/2012 0030       Studies/Results: Dg Abd 1 View  10/15/2012   *RADIOLOGY REPORT*  Clinical Data: 79 year old female - NG tube placement.  ABDOMEN - 1 VIEW  Comparison: 10/15/2012 abdominal radiograph  Findings: An NG tube has been placed under fluoroscopic guidance by radiology personnel. The NG  tube tip overlies the region of the mid stomach.  IMPRESSION: NG tube tip overlies the region of the mid stomach.   Original Report Authenticated By: Harmon Pier, M.D.   Dg Abd 2 Views  10/16/2012   *RADIOLOGY REPORT*  Clinical Data: Follow-up small bowel obstruction  ABDOMEN - 2 VIEW  Comparison: Yesterday  Findings: There is no free intraperitoneal gas.  NG tube has been placed and the tip is in the antrum of the stomach.  Nonspecific air fluid levels without disproportionate dilatation.  Barium is present in the colon.  IMPRESSION: Nonobstructive bowel gas pattern.  NG tube placed with its tip in the antrum.  No free intraperitoneal gas.   Original Report Authenticated By: Jolaine Click, M.D.   Dg Abd 2 Views  10/15/2012   *RADIOLOGY REPORT*  Clinical Data: Abdominal pain, nausea, constipation  ABDOMEN - 2 VIEW  Comparison: 10/14/2012  Findings: Retained contrast or other radiodense material within the nondilated right colon.  Normal bowel gas pattern.  Mild rightward curvature of the lumbar spine centered at L1 noted.  No free air. No dilated loop of bowel.  Pelvic phleboliths are noted.  Possible stool content overlying the  right side of the sacrum.  Splenic arterial calcification.  IMPRESSION: Normal bowel gas pattern.   Original Report Authenticated By: Christiana Pellant, M.D.   Dg Abd Acute W/chest  10/14/2012   *RADIOLOGY REPORT*  Clinical Data: Vomiting  ACUTE ABDOMEN SERIES (ABDOMEN 2 VIEW & CHEST 1 VIEW)  Comparison: CT abdomen pelvis dated 10/13/2012.  Chest/abdomen radiographs dated 10/13/2012.  Findings: Chronic interstitial markings/emphysematous changes. Possible mild left basilar atelectasis.  No pleural effusion or pneumothorax.  The heart is normal in size.  Nonspecific bowel gas pattern, reflecting multiple fluid filled loops of dilated small bowel when correlating with recent CT, worrisome for small bowel obstruction.  No evidence of free air under the diaphragm on the upright view.   Degenerative changes of the visualized thoracolumbar spine.  IMPRESSION: No evidence of acute cardiopulmonary disease.  Nonspecific bowel gas pattern, corresponding to multiple fluid filled loops of dilated small bowel on CT, worrisome for small bowel obstruction.  No free air.   Original Report Authenticated By: Charline Bills, M.D.   Dg Naso G Tube Plc W/fl-no Rad  10/15/2012   CLINICAL DATA: place NG tube, unable to place on the floor   NASO G TUBE PLACEMENT WITH FLUORO  Fluoroscopy was utilized by the requesting physician.  No radiographic  interpretation.     Anti-infectives: Anti-infectives   Start     Dose/Rate Route Frequency Ordered Stop   10/13/12 2200  cefTRIAXone (ROCEPHIN) 1 g in dextrose 5 % 50 mL IVPB  Status:  Discontinued     1 g 100 mL/hr over 30 Minutes Intravenous Every 24 hours 10/13/12 0647 10/14/12 1052   10/13/12 0200  cefTRIAXone (ROCEPHIN) 1 g in dextrose 5 % 50 mL IVPB     1 g 100 mL/hr over 30 Minutes Intravenous  Once 10/13/12 0153 10/13/12 0307       Assessment/Plan  1. SBO, resolving radiographically  Plan: 1. X-rays reveal a normal bowel gas pattern with contrast in her colon.  She has already put out over 600cc of bilious output today from her NGT.  She still is without flatus.  We will try another suppository today to see if that will help stimulate her some.  Also would probably leave NGT at least for today.  We will follow.  Doubt she will need an operation.    LOS: 4 days    Seydina Holliman E 10/16/2012, 9:11 AM Pager: [phone]

## 2012-10-16 NOTE — Progress Notes (Signed)
Assessment/Plan: Principal Problem:   Small bowel obstruction - she is stable. Lot's of NG output. Dr. Doreen Salvage and Ms. Carlyon Nolasco's notes reviewed and appreciated. Still hoping that she will open up.  Active Problems:   Weight loss, unintentional   Hypertension   UTI (lower urinary tract infection) - day #4 ceftriaxone   Pulmonary nodule   Thrombocytopenia, unspecified - noted pharmacy recommendation to stop heparin and use SCDs. So ordered.    Subjective: Feels okay. Some crampy abd pain that comes and goes. No flatus. Has been urinating.  Objective:  Vital Signs: Filed Vitals:   10/15/12 0708 10/15/12 1300 10/15/12 1946 10/16/12 0558  BP: 129/66 165/82 151/67 145/65  Pulse: 115 85 93 90  Temp: 98.7 F (37.1 C) 98.5 F (36.9 C) 98.3 F (36.8 C) 98.6 F (37 C)  TempSrc: Oral Oral Oral Oral  Resp: 16 16 16 16   SpO2: 95% 93% 95% 95%     EXAM: ABD: soft, minimal tenderness. Normal BS   Intake/Output Summary (Last 24 hours) at 10/16/12 0801 Last data filed at 10/15/12 1431  Gross per 24 hour  Intake      0 ml  Output    800 ml  Net   -800 ml    Lab Results:  Recent Labs  10/14/12 1000 10/15/12 0832  NA 133* 137  K 3.6 3.9  CL 95* 100  CO2 25 26  GLUCOSE 109* 85  BUN 19 20  CREATININE 0.71 0.75  CALCIUM 9.4 8.9    Recent Labs  10/14/12 1000 10/15/12 0832  AST 20 17  ALT 12 11  ALKPHOS 58 52  BILITOT 0.6 0.6  PROT 6.7 6.2  ALBUMIN 3.4* 3.2*   No results found for this basename: LIPASE, AMYLASE,  in the last 72 hours  Recent Labs  10/14/12 1000 10/15/12 0832  WBC 7.0 5.0  HGB 16.8* 15.9*  HCT 48.0* 46.9*  MCV 82.8 83.0  PLT 76* 81*   No results found for this basename: CKTOTAL, CKMB, CKMBINDEX, TROPONINI,  in the last 72 hours No components found with this basename: POCBNP,  No results found for this basename: DDIMER,  in the last 72 hours No results found for this basename: HGBA1C,  in the last 72 hours No results found for this  basename: CHOL, HDL, LDLCALC, TRIG, CHOLHDL, LDLDIRECT,  in the last 72 hours No results found for this basename: TSH, T4TOTAL, FREET3, T3FREE, THYROIDAB,  in the last 72 hours No results found for this basename: VITAMINB12, FOLATE, FERRITIN, TIBC, IRON, RETICCTPCT,  in the last 72 hours  Studies/Results: Dg Abd 1 View  10/15/2012   *RADIOLOGY REPORT*  Clinical Data: 79 year old female - NG tube placement.  ABDOMEN - 1 VIEW  Comparison: 10/15/2012 abdominal radiograph  Findings: An NG tube has been placed under fluoroscopic guidance by radiology personnel. The NG tube tip overlies the region of the mid stomach.  IMPRESSION: NG tube tip overlies the region of the mid stomach.   Original Report Authenticated By: Harmon Pier, M.D.   Dg Abd 2 Views  10/15/2012   *RADIOLOGY REPORT*  Clinical Data: Abdominal pain, nausea, constipation  ABDOMEN - 2 VIEW  Comparison: 10/14/2012  Findings: Retained contrast or other radiodense material within the nondilated right colon.  Normal bowel gas pattern.  Mild rightward curvature of the lumbar spine centered at L1 noted.  No free air. No dilated loop of bowel.  Pelvic phleboliths are noted.  Possible stool content overlying the right side of the sacrum.  Splenic arterial calcification.  IMPRESSION: Normal bowel gas pattern.   Original Report Authenticated By: Christiana Pellant, M.D.   Dg Abd Acute W/chest  10/14/2012   *RADIOLOGY REPORT*  Clinical Data: Vomiting  ACUTE ABDOMEN SERIES (ABDOMEN 2 VIEW & CHEST 1 VIEW)  Comparison: CT abdomen pelvis dated 10/13/2012.  Chest/abdomen radiographs dated 10/13/2012.  Findings: Chronic interstitial markings/emphysematous changes. Possible mild left basilar atelectasis.  No pleural effusion or pneumothorax.  The heart is normal in size.  Nonspecific bowel gas pattern, reflecting multiple fluid filled loops of dilated small bowel when correlating with recent CT, worrisome for small bowel obstruction.  No evidence of free air under the  diaphragm on the upright view.  Degenerative changes of the visualized thoracolumbar spine.  IMPRESSION: No evidence of acute cardiopulmonary disease.  Nonspecific bowel gas pattern, corresponding to multiple fluid filled loops of dilated small bowel on CT, worrisome for small bowel obstruction.  No free air.   Original Report Authenticated By: Charline Bills, M.D.   Dg Naso G Tube Plc W/fl-no Rad  10/15/2012   CLINICAL DATA: place NG tube, unable to place on the floor   NASO G TUBE PLACEMENT WITH FLUORO  Fluoroscopy was utilized by the requesting physician.  No radiographic  interpretation.    Medications: Medications administered in the last 24 hours reviewed.  Current Medication List reviewed.    LOS: 4 days   [facility] Internal [facility] @ [facility] [phone]) 10/16/2012, 8:01 AM

## 2012-10-16 NOTE — Progress Notes (Signed)
Small bm

## 2012-10-16 NOTE — Progress Notes (Signed)
Agree with above 

## 2012-10-17 DIAGNOSIS — E876 Hypokalemia: Secondary | ICD-10-CM | POA: Diagnosis not present

## 2012-10-17 LAB — CBC
HCT: 41.9 % (ref 36.0–46.0)
Hemoglobin: 14.6 g/dL (ref 12.0–15.0)
MCH: 29 pg (ref 26.0–34.0)
MCHC: 34.8 g/dL (ref 30.0–36.0)
MCV: 83.1 fL (ref 78.0–100.0)
Platelets: 73 10*3/uL — ABNORMAL LOW (ref 150–400)
RBC: 5.04 MIL/uL (ref 3.87–5.11)
RDW: 14 % (ref 11.5–15.5)
WBC: 4.1 10*3/uL (ref 4.0–10.5)

## 2012-10-17 LAB — BASIC METABOLIC PANEL
BUN: 19 mg/dL (ref 6–23)
CO2: 28 mEq/L (ref 19–32)
Calcium: 7.8 mg/dL — ABNORMAL LOW (ref 8.4–10.5)
Chloride: 102 mEq/L (ref 96–112)
Creatinine, Ser: 0.72 mg/dL (ref 0.50–1.10)
GFR calc Af Amer: 86 mL/min — ABNORMAL LOW (ref >90)
GFR calc non Af Amer: 74 mL/min — ABNORMAL LOW (ref >90)
Glucose, Bld: 64 mg/dL — ABNORMAL LOW (ref 70–99)
Potassium: 2.7 mEq/L — CL (ref 3.5–5.1)
Sodium: 142 mEq/L (ref 135–145)

## 2012-10-17 LAB — GLUCOSE, CAPILLARY
Glucose-Capillary: 55 mg/dL — ABNORMAL LOW (ref 70–99)
Glucose-Capillary: 155 mg/dL — ABNORMAL HIGH (ref 70–99)

## 2012-10-17 MED ORDER — POTASSIUM CHLORIDE IN NACL 20-0.9 MEQ/L-% IV SOLN
INTRAVENOUS | Status: DC
  Administered 2012-10-17 (×3): via INTRAVENOUS
  Filled 2012-10-17 (×6): qty 1000

## 2012-10-17 MED ORDER — POTASSIUM CHLORIDE 10 MEQ/100ML IV SOLN
10.0000 meq | INTRAVENOUS | Status: DC
  Filled 2012-10-17 (×5): qty 100

## 2012-10-17 MED ORDER — POTASSIUM CHLORIDE 10 MEQ/100ML IV SOLN
10.0000 meq | INTRAVENOUS | Status: DC
  Filled 2012-10-17 (×3): qty 100

## 2012-10-17 MED ORDER — DEXTROSE 50 % IV SOLN
INTRAVENOUS | Status: AC
  Administered 2012-10-17: 50 mL
  Filled 2012-10-17: qty 50

## 2012-10-17 MED ORDER — POTASSIUM CHLORIDE 10 MEQ/100ML IV SOLN
10.0000 meq | INTRAVENOUS | Status: AC
  Administered 2012-10-17 (×4): 10 meq via INTRAVENOUS
  Filled 2012-10-17 (×4): qty 100

## 2012-10-17 NOTE — Progress Notes (Signed)
Patient ID: Amy Ward, female   DOB: 01/31/1922, 79 y.o.   MRN: [id number]  CARE TEAM:  PCP: Darnelle Bos, MD   Subjective: Pt feeling better, had 2 BMs yesterday, passing flatus.  Complaining of dry mouth and taking in lots of ice chips, 2 cups just today.  Objective:  Vital signs:  Filed Vitals:   10/16/12 1300 10/16/12 2106 10/16/12 2250 10/17/12 0628  BP: 153/59 106/81 142/76 135/61  Pulse: 93 91 96 72  Temp: 97.9 F (36.6 C) 98.2 F (36.8 C)  98.2 F (36.8 C)  TempSrc:  Oral  Oral  Resp: 16 18  18   SpO2: 98% 98%  98%    Last BM Date: 10/16/12  Intake/Output   Yesterday:  08/05 0701 - 08/06 0700 In: 1100 [I.V.:1100] Out: 1200 [Emesis/NG output:1200] This shift:    Physical Exam:  General: Pt awake/alert/oriented x4 in no acute distress Chest: CTA No chest wall pain w good excursion CV:  S1S2 rrr no murmurs, rubs or gallops.  +2 pulses, no edema. Abdomen: Soft.  Nondistended and non tender.  No evidence of peritonitis.  No incarcerated hernias.  Problem List:   Principal Problem:   Small bowel obstruction Active Problems:   Weight loss, unintentional   Hypertension   UTI (lower urinary tract infection)   Pulmonary nodule   Thrombocytopenia, unspecified   Hypokalemia    Results:   Labs: Results for orders placed during the hospital encounter of 10/12/12 (from the past 48 hour(s))  GLUCOSE, CAPILLARY     Status: Abnormal   Collection Time    10/16/12  7:25 AM      Result Value Range   Glucose-Capillary 60 (*) 70 - 99 mg/dL   Comment 1 Notify RN    GLUCOSE, CAPILLARY     Status: Abnormal   Collection Time    10/16/12  8:16 AM      Result Value Range   Glucose-Capillary 128 (*) 70 - 99 mg/dL   Comment 1 Notify RN    CBC     Status: Abnormal   Collection Time    10/16/12  8:45 AM      Result Value Range   WBC 4.8  4.0 - 10.5 K/uL   RBC 5.64 (*) 3.87 - 5.11 MIL/uL   Hemoglobin 16.5 (*) 12.0 - 15.0 g/dL   HCT 11.9 (*) 14.7 -  46.0 %   MCV 83.3  78.0 - 100.0 fL   MCH 29.3  26.0 - 34.0 pg   MCHC 35.1  30.0 - 36.0 g/dL   RDW 82.9  56.2 - 13.0 %   Platelets 87 (*) 150 - 400 K/uL   Comment: CONSISTENT WITH PREVIOUS RESULT  BASIC METABOLIC PANEL     Status: Abnormal   Collection Time    10/16/12  8:45 AM      Result Value Range   Sodium 141  135 - 145 mEq/L   Potassium 2.9 (*) 3.5 - 5.1 mEq/L   Comment: SLIGHT HEMOLYSIS   Chloride 103  96 - 112 mEq/L   CO2 27  19 - 32 mEq/L   Glucose, Bld 117 (*) 70 - 99 mg/dL   BUN 23  6 - 23 mg/dL   Creatinine, Ser 8.65  0.50 - 1.10 mg/dL   Calcium 8.5  8.4 - 78.4 mg/dL   GFR calc non Af Amer 73 (*) >90 mL/min   GFR calc Af Amer 84 (*) >90 mL/min   Comment:  The eGFR has been calculated     using the CKD EPI equation.     This calculation has not been     validated in all clinical     situations.     eGFR's persistently     <90 mL/min signify     possible Chronic Kidney Disease.  CBC     Status: Abnormal   Collection Time    10/17/12  5:00 AM      Result Value Range   WBC 4.1  4.0 - 10.5 K/uL   RBC 5.04  3.87 - 5.11 MIL/uL   Hemoglobin 14.6  12.0 - 15.0 g/dL   HCT 19.1  47.8 - 29.5 %   MCV 83.1  78.0 - 100.0 fL   MCH 29.0  26.0 - 34.0 pg   MCHC 34.8  30.0 - 36.0 g/dL   RDW 62.1  30.8 - 65.7 %   Platelets 73 (*) 150 - 400 K/uL   Comment: CONSISTENT WITH PREVIOUS RESULT  BASIC METABOLIC PANEL     Status: Abnormal   Collection Time    10/17/12  5:00 AM      Result Value Range   Sodium 142  135 - 145 mEq/L   Potassium 2.7 (*) 3.5 - 5.1 mEq/L   Comment: CRITICAL RESULT CALLED TO, READ BACK BY AND VERIFIED WITH:     Vashti Hey [id number] WILDERK   Chloride 102  96 - 112 mEq/L   CO2 28  19 - 32 mEq/L   Glucose, Bld 64 (*) 70 - 99 mg/dL   BUN 19  6 - 23 mg/dL   Creatinine, Ser 9.52  0.50 - 1.10 mg/dL   Calcium 7.8 (*) 8.4 - 10.5 mg/dL   GFR calc non Af Amer 74 (*) >90 mL/min   GFR calc Af Amer 86 (*) >90 mL/min   Comment:            The eGFR  has been calculated     using the CKD EPI equation.     This calculation has not been     validated in all clinical     situations.     eGFR's persistently     <90 mL/min signify     possible Chronic Kidney Disease.  GLUCOSE, CAPILLARY     Status: Abnormal   Collection Time    10/17/12  6:37 AM      Result Value Range   Glucose-Capillary 55 (*) 70 - 99 mg/dL  GLUCOSE, CAPILLARY     Status: Abnormal   Collection Time    10/17/12  7:37 AM      Result Value Range   Glucose-Capillary 155 (*) 70 - 99 mg/dL   Comment 1 Notify RN      Imaging / Studies: Dg Abd 1 View  10/15/2012   *RADIOLOGY REPORT*  Clinical Data: 79 year old female - NG tube placement.  ABDOMEN - 1 VIEW  Comparison: 10/15/2012 abdominal radiograph  Findings: An NG tube has been placed under fluoroscopic guidance by radiology personnel. The NG tube tip overlies the region of the mid stomach.  IMPRESSION: NG tube tip overlies the region of the mid stomach.   Original Report Authenticated By: Harmon Pier, M.D.   Dg Abd 2 Views  10/16/2012   *RADIOLOGY REPORT*  Clinical Data: Follow-up small bowel obstruction  ABDOMEN - 2 VIEW  Comparison: Yesterday  Findings: There is no free intraperitoneal gas.  NG tube has been placed and the tip is in the  antrum of the stomach.  Nonspecific air fluid levels without disproportionate dilatation.  Barium is present in the colon.  IMPRESSION: Nonobstructive bowel gas pattern.  NG tube placed with its tip in the antrum.  No free intraperitoneal gas.   Original Report Authenticated By: Jolaine Click, M.D.   Dg Abd 2 Views  10/15/2012   *RADIOLOGY REPORT*  Clinical Data: Abdominal pain, nausea, constipation  ABDOMEN - 2 VIEW  Comparison: 10/14/2012  Findings: Retained contrast or other radiodense material within the nondilated right colon.  Normal bowel gas pattern.  Mild rightward curvature of the lumbar spine centered at L1 noted.  No free air. No dilated loop of bowel.  Pelvic phleboliths are noted.   Possible stool content overlying the right side of the sacrum.  Splenic arterial calcification.  IMPRESSION: Normal bowel gas pattern.   Original Report Authenticated By: Christiana Pellant, M.D.   Dg Naso G Tube Plc W/fl-no Rad  10/15/2012   CLINICAL DATA: place NG tube, unable to place on the floor   NASO G TUBE PLACEMENT WITH FLUORO  Fluoroscopy was utilized by the requesting physician.  No radiographic  interpretation.     Medications / Allergies: per chart  Antibiotics: Anti-infectives   Start     Dose/Rate Route Frequency Ordered Stop   10/13/12 2200  cefTRIAXone (ROCEPHIN) 1 g in dextrose 5 % 50 mL IVPB  Status:  Discontinued     1 g 100 mL/hr over 30 Minutes Intravenous Every 24 hours 10/13/12 0647 10/14/12 1052   10/13/12 0200  cefTRIAXone (ROCEPHIN) 1 g in dextrose 5 % 50 mL IVPB     1 g 100 mL/hr over 30 Minutes Intravenous  Once 10/13/12 0153 10/13/12 0307      Assessment/Plan  SBO -resolving, had 2 bowel movements.  Increased NG output from water/ice chips -Clamp NGT for 4 hours, if no n/v develops start clear liquid diet -ambulate  IV appear infiltrated, I stopped the K run and notified the nurse.  Ashok Norris, [facility] Surgery Pager [phone] Office [phone]  10/17/2012 9:51 AM

## 2012-10-17 NOTE — Progress Notes (Signed)
Assessment/Plan: Principal Problem:   Small bowel obstruction - may be beginning to open up. Clinically, she is certainly not worsening.  Active Problems:   Weight loss, unintentional   Hypertension - BP is acceptable   UTI (lower urinary tract infection)   Pulmonary nodule   Thrombocytopenia, unspecified   Hypokalemia - I thought I added K to her IVF last week, but apparently I did not. Give runs today and add to IVF. Then recheck K in AM.    Subjective: Feels okay. Happy to get some ice chips. She had some BMs yesterday. Thinks she passed some flatus as well.   Objective:  Vital Signs: Filed Vitals:   10/16/12 1300 10/16/12 2106 10/16/12 2250 10/17/12 0628  BP: 153/59 106/81 142/76 135/61  Pulse: 93 91 96 72  Temp: 97.9 F (36.6 C) 98.2 F (36.8 C)  98.2 F (36.8 C)  TempSrc:  Oral  Oral  Resp: 16 18  18   SpO2: 98% 98%  98%     EXAM: soft, nontender, not distended at all   Intake/Output Summary (Last 24 hours) at 10/17/12 0743 Last data filed at 10/17/12 7846  Gross per 24 hour  Intake   1100 ml  Output   1200 ml  Net   -100 ml    Lab Results:  Recent Labs  10/16/12 0845 10/17/12 0500  NA 141 142  K 2.9* 2.7*  CL 103 102  CO2 27 28  GLUCOSE 117* 64*  BUN 23 19  CREATININE 0.75 0.72  CALCIUM 8.5 7.8*    Recent Labs  10/14/12 1000 10/15/12 0832  AST 20 17  ALT 12 11  ALKPHOS 58 52  BILITOT 0.6 0.6  PROT 6.7 6.2  ALBUMIN 3.4* 3.2*   No results found for this basename: LIPASE, AMYLASE,  in the last 72 hours  Recent Labs  10/16/12 0845 10/17/12 0500  WBC 4.8 4.1  HGB 16.5* 14.6  HCT 47.0* 41.9  MCV 83.3 83.1  PLT 87* 73*   No results found for this basename: CKTOTAL, CKMB, CKMBINDEX, TROPONINI,  in the last 72 hours No components found with this basename: POCBNP,  No results found for this basename: DDIMER,  in the last 72 hours No results found for this basename: HGBA1C,  in the last 72 hours No results found for this basename: CHOL,  HDL, LDLCALC, TRIG, CHOLHDL, LDLDIRECT,  in the last 72 hours No results found for this basename: TSH, T4TOTAL, FREET3, T3FREE, THYROIDAB,  in the last 72 hours No results found for this basename: VITAMINB12, FOLATE, FERRITIN, TIBC, IRON, RETICCTPCT,  in the last 72 hours  Studies/Results: Dg Abd 1 View  10/15/2012   *RADIOLOGY REPORT*  Clinical Data: 79 year old female - NG tube placement.  ABDOMEN - 1 VIEW  Comparison: 10/15/2012 abdominal radiograph  Findings: An NG tube has been placed under fluoroscopic guidance by radiology personnel. The NG tube tip overlies the region of the mid stomach.  IMPRESSION: NG tube tip overlies the region of the mid stomach.   Original Report Authenticated By: Harmon Pier, M.D.   Dg Abd 2 Views  10/16/2012   *RADIOLOGY REPORT*  Clinical Data: Follow-up small bowel obstruction  ABDOMEN - 2 VIEW  Comparison: Yesterday  Findings: There is no free intraperitoneal gas.  NG tube has been placed and the tip is in the antrum of the stomach.  Nonspecific air fluid levels without disproportionate dilatation.  Barium is present in the colon.  IMPRESSION: Nonobstructive bowel gas pattern.  NG tube  placed with its tip in the antrum.  No free intraperitoneal gas.   Original Report Authenticated By: Jolaine Click, M.D.   Dg Abd 2 Views  10/15/2012   *RADIOLOGY REPORT*  Clinical Data: Abdominal pain, nausea, constipation  ABDOMEN - 2 VIEW  Comparison: 10/14/2012  Findings: Retained contrast or other radiodense material within the nondilated right colon.  Normal bowel gas pattern.  Mild rightward curvature of the lumbar spine centered at L1 noted.  No free air. No dilated loop of bowel.  Pelvic phleboliths are noted.  Possible stool content overlying the right side of the sacrum.  Splenic arterial calcification.  IMPRESSION: Normal bowel gas pattern.   Original Report Authenticated By: Christiana Pellant, M.D.   Dg Naso G Tube Plc W/fl-no Rad  10/15/2012   CLINICAL DATA: place NG tube, unable  to place on the floor   NASO G TUBE PLACEMENT WITH FLUORO  Fluoroscopy was utilized by the requesting physician.  No radiographic  interpretation.    Medications: Medications administered in the last 24 hours reviewed.  Current Medication List reviewed.    LOS: 5 days   [facility] Internal Medicine @ [facility] [phone]) 10/17/2012, 7:43 AM

## 2012-10-17 NOTE — Progress Notes (Signed)
   CARE MANAGEMENT NOTE 10/17/2012  Patient:  JAX, ABDELRAHMAN   Account Number:  [id number]  Date Initiated:  10/17/2012  Documentation initiated by:  [facility]  Subjective/Objective Assessment:   small bowel obstruction, UTI     Action/Plan:   lives at [facility] [place]   Anticipated DC Date:  10/19/2012   Anticipated DC Plan:  HOME W HOME HEALTH SERVICES      DC Planning Services  CM consult      Choice offered to / List presented to:             Status of service:  In process, will continue to follow Medicare Important Message given?   (If response is "NO", the following Medicare IM given date fields will be blank) Date Medicare IM given:   Date Additional Medicare IM given:    Discharge Disposition:    Per UR Regulation:    If discussed at Long Length of Stay Meetings, dates discussed:    Comments:  10-17-12 16:00 Pt states she is in the process of moving to [employer] indep tier and states her son, Richarda Overlie [phone] is making all the arrangements.  CM will continue to follow for possible needs. Freddy Jaksch, BSN, CM

## 2012-10-17 NOTE — Progress Notes (Signed)
CRITICAL VALUE ALERT  Critical value received:  Potassium 2.7  Date of notification:  10/17/12  Time of notification:  0627  Critical value read back:yes  Nurse who received alert:  Rella Larve, RN  MD notified (1st page):  On call for Dr. [name]  Time of first page:  [phone]  MD notified (2nd page):  Maren Reamer, NP/[facility]  Time of second page:  [phone]  Responding MD:  Dr. Valentina Lucks (for Dr. [name])  Time MD responded:  [phone]

## 2012-10-18 DIAGNOSIS — R5381 Other malaise: Secondary | ICD-10-CM

## 2012-10-18 LAB — BASIC METABOLIC PANEL
BUN: 9 mg/dL (ref 6–23)
CO2: 29 mEq/L (ref 19–32)
Calcium: 7.8 mg/dL — ABNORMAL LOW (ref 8.4–10.5)
Chloride: 105 mEq/L (ref 96–112)
Creatinine, Ser: 0.59 mg/dL (ref 0.50–1.10)
GFR calc Af Amer: >90 mL/min (ref >90)
GFR calc non Af Amer: 79 mL/min — ABNORMAL LOW (ref >90)
Glucose, Bld: 96 mg/dL (ref 70–99)
Potassium: 3.5 mEq/L (ref 3.5–5.1)
Sodium: 141 mEq/L (ref 135–145)

## 2012-10-18 NOTE — Progress Notes (Signed)
Rehab Admissions Coordinator Note:  Patient was screened by Brock Ra for appropriateness for an Inpatient Acute Rehab Consult.  At this time, we are recommending Inpatient Rehab consult.  Brock Ra 10/18/2012, 12:25 PM  I can be reached at [phone].

## 2012-10-18 NOTE — Progress Notes (Signed)
Assessment/Plan: Principal Problem:   Small bowel obstruction - improved. Will advance diet. Should be ready for d/c on 8/8 if she is strong enough to be at [facility] (not clear where she will go home to as she is in process of moving when this happened).  Active Problems:   Weight loss, unintentional   Hypertension   UTI (lower urinary tract infection)   Pulmonary nodule   Thrombocytopenia, unspecified   Hypokalemia - level pending.    Subjective: She is feeling well. She is having flatus and she is having BMs. Denies abd pain, nausea.   Objective:  Vital Signs: Filed Vitals:   10/17/12 0628 10/17/12 1249 10/17/12 2054 10/18/12 0544  BP: 135/61 144/91 166/63 154/79  Pulse: 72 89 103 109  Temp: 98.2 F (36.8 C) 98.5 F (36.9 C) 98.4 F (36.9 C) 98.4 F (36.9 C)  TempSrc: Oral   Oral  Resp: 18 18 16 16   SpO2: 98% 96% 95% 97%     EXAM: Abd: soft, nontender.    Intake/Output Summary (Last 24 hours) at 10/18/12 0705 Last data filed at 10/18/12 1610  Gross per 24 hour  Intake 2495.83 ml  Output     13 ml  Net 2482.83 ml    Lab Results:  Recent Labs  10/17/12 0500 10/18/12 0500  NA 142 141  K 2.7* 3.5  CL 102 105  CO2 28 29  GLUCOSE 64* 96  BUN 19 9  CREATININE 0.72 0.59  CALCIUM 7.8* 7.8*    Recent Labs  10/15/12 0832  AST 17  ALT 11  ALKPHOS 52  BILITOT 0.6  PROT 6.2  ALBUMIN 3.2*   No results found for this basename: LIPASE, AMYLASE,  in the last 72 hours  Recent Labs  30-Oct-2012 0845 10/17/12 0500  WBC 4.8 4.1  HGB 16.5* 14.6  HCT 47.0* 41.9  MCV 83.3 83.1  PLT 87* 73*   No results found for this basename: CKTOTAL, CKMB, CKMBINDEX, TROPONINI,  in the last 72 hours No components found with this basename: POCBNP,  No results found for this basename: DDIMER,  in the last 72 hours No results found for this basename: HGBA1C,  in the last 72 hours No results found for this basename: CHOL, HDL, LDLCALC, TRIG, CHOLHDL, LDLDIRECT,  in  the last 72 hours No results found for this basename: TSH, T4TOTAL, FREET3, T3FREE, THYROIDAB,  in the last 72 hours No results found for this basename: VITAMINB12, FOLATE, FERRITIN, TIBC, IRON, RETICCTPCT,  in the last 72 hours  Studies/Results: Dg Abd 2 Views  10-30-12   *RADIOLOGY REPORT*  Clinical Data: Follow-up small bowel obstruction  ABDOMEN - 2 VIEW  Comparison: Yesterday  Findings: There is no free intraperitoneal gas.  NG tube has been placed and the tip is in the antrum of the stomach.  Nonspecific air fluid levels without disproportionate dilatation.  Barium is present in the colon.  IMPRESSION: Nonobstructive bowel gas pattern.  NG tube placed with its tip in the antrum.  No free intraperitoneal gas.   Original Report Authenticated By: Jolaine Click, M.D.   Medications: Medications administered in the last 24 hours reviewed.  Current Medication List reviewed.    LOS: 6 days   [facility] Internal Medicine @ [facility] [phone]) 10/18/2012, 7:05 AM

## 2012-10-18 NOTE — Progress Notes (Signed)
Patient is from independent living at [facility]. Patient will return home with [facility]. Clinical Social Worker will sign off for now as social work intervention is no longer needed. Please consult [place] again if new need arises.   Sabino Niemann, MSW, [phone]

## 2012-10-18 NOTE — Evaluation (Signed)
Physical Therapy Evaluation Patient Details Name: Amy Ward MRN: [id number] DOB: 1921-11-11 Today's Date: 10/18/2012 Time: 4098-1191 PT Time Calculation (min): 46 min  PT Assessment / Plan / Recommendation History of Present Illness  Small Bowel Obstruction that cleared without surgery  Clinical Impression  Very sweet lady but has developed weakness and imbalance during stay. She currently requires ~ mod assist for ambulation with RW due to poor balance reactions and very narrow base of support resulting in lateral and posterior losses of balance. She will need 24 hour supervision due to impaired cognition (decreased safety awareness, impaired attention, tangential and very disorganized thought processes.) Doubtful that [facility] which is an assisted living facility will be able to provide level of care needed. Recommend continued rehab via CIR or SNF to promote safe return to assisted living. Will follow acutely.     PT Assessment  Patient needs continued PT services    Follow Up Recommendations  CIR;SNF;Supervision for mobility/OOB    Does the patient have the potential to tolerate intense rehabilitation    potentially  Barriers to Discharge Decreased caregiver support      Equipment Recommendations  None recommended by PT    Recommendations for Other Services Rehab consult   Frequency Min 3X/week    Precautions / Restrictions Precautions Precautions: Fall   Pertinent Vitals/Pain No c/o pain      Mobility  Bed Mobility Bed Mobility: Rolling Right;Right Sidelying to Sit Rolling Right: 4: Min assist Right Sidelying to Sit: 4: Min assist Details for Bed Mobility Assistance: Cues for sequencing and technique, repeated cues for attention to task.  Transfers Transfers: Sit to Stand;Stand to Sit Sit to Stand: 4: Min assist;3: Mod assist Stand to Sit: 4: Min assist Details for Transfer Assistance: Cues for hand placement, posterior lean noted which decreased with  repetitions.  Ambulation/Gait Ambulation/Gait Assistance: 3: Mod assist Ambulation Distance (Feet): 40 Feet Assistive device: Rolling walker Ambulation/Gait Assistance Details: Pt stops nearly every step and talks, unable to focus on ambulation. Cues for widdening base of support, assist needed to safely manage RW.  Gait Pattern: Decreased stride length;Narrow base of support;Trunk flexed (tandem walking, steps on toe of stance foot) Stairs: No        PT Diagnosis: Difficulty walking;Abnormality of gait;Generalized weakness  PT Problem List: Decreased strength;Decreased activity tolerance;Decreased balance;Decreased knowledge of use of DME;Decreased cognition;Decreased coordination;Decreased mobility;Decreased safety awareness PT Treatment Interventions: DME instruction;Gait training;Functional mobility training;Therapeutic activities;Patient/family education;Cognitive remediation;Neuromuscular re-education;Balance training;Therapeutic exercise;Wheelchair mobility training     PT Goals(Current goals can be found in the care plan section) Acute Rehab PT Goals Patient Stated Goal: Go to [facility] PT Goal Formulation: With patient Time For Goal Achievement: 10/18/12 Potential to Achieve Goals: Good  Visit Information  Last PT Received On: 10/18/12 Assistance Needed: +1 History of Present Illness: Small Bowel Obstruction that cleared without surgery       Prior Functioning  Home Living Family/patient expects to be discharged to:: Assisted living Home Equipment: Walker - 4 wheels Additional Comments: Pt anticipates to D/C to [facility] Prior Function Level of Independence: Independent with assistive device(s) Comments: Used rollator walker for about a week prior to coming to hospital. Pt reports she is a furniture walker at home.  Communication Communication: Other (comment) (Repetitive, tangential, cognitive disorganization)    Cognition  Cognition Arousal/Alertness:  Awake/alert Behavior During Therapy: WFL for tasks assessed/performed Overall Cognitive Status: No family/caregiver present to determine baseline cognitive functioning (Cognition disorganize, decr. STM, decr. awareness)  Extremity/Trunk Assessment Upper Extremity Assessment Upper Extremity Assessment: Generalized weakness Lower Extremity Assessment Lower Extremity Assessment: Generalized weakness Cervical / Trunk Assessment Cervical / Trunk Assessment: Kyphotic   Balance Balance Balance Assessed: Yes Static Standing Balance Static Standing - Balance Support: Bilateral upper extremity supported Static Standing - Level of Assistance: 4: Min assist  End of Session PT - End of Session Equipment Utilized During Treatment: Gait belt Activity Tolerance: Patient limited by fatigue;Patient tolerated treatment well Patient left: in chair;with chair alarm set Nurse Communication: Mobility status  GP     Wilhemina Bonito 10/18/2012, 12:04 PM

## 2012-10-18 NOTE — Progress Notes (Signed)
  Subjective: Having bms and flatus, tol clears  Objective: Vital signs in last 24 hours: Temp:  [98.4 F (36.9 C)-98.5 F (36.9 C)] 98.4 F (36.9 C) (08/07 0544) Pulse Rate:  [89-109] 109 (08/07 0544) Resp:  [16-18] 16 (08/07 0544) BP: (144-166)/(63-91) 154/79 mmHg (08/07 0544) SpO2:  [95 %-97 %] 97 % (08/07 0544) Last BM Date: 10/17/12  Intake/Output from previous day: 08/06 0701 - 08/07 0700 In: 2495.8 [I.V.:2495.8] Out: 13 [Urine:11; Stool:2] Intake/Output this shift:    GI: soft bs present nontender  Lab Results:   Recent Labs  10/16/12 0845 10/17/12 0500  WBC 4.8 4.1  HGB 16.5* 14.6  HCT 47.0* 41.9  PLT 87* 73*   BMET  Recent Labs  10/17/12 0500 10/18/12 0500  NA 142 141  K 2.7* 3.5  CL 102 105  CO2 28 29  GLUCOSE 64* 96  BUN 19 9  CREATININE 0.72 0.59  CALCIUM 7.8* 7.8*    Assessment/Plan: Resolved sbo  Agree with Dr Karma Greaser plan, advance diet, discharge when ready  [facility] 10/18/2012

## 2012-10-18 NOTE — Consult Note (Signed)
Physical Medicine and Rehabilitation Consult Reason for Consult: Deconditioning  Referring Physician: Dr. [name]   HPI: Amy Ward is a 79 y.o. female with history of HTN otherwise in good health. Admitted on 10/13/12 with sudden onset of  nause and vomiting as well as reports of three day history of constipation. CT abdomen with evidence of SBO and patient treated conservatively with NGT as well as bowel program with improvement in abdominal distension. Started on clears yesterday and NDT d/c. PT evaluation done today and patient with impaired balance as well as cognitive deficits impacting mobility. MD, PT recommending CIR.   "I don't like you" when I introduced myself to pt.  Family states pt has been agitated since wetting bed.  Review of Systems  Unable to perform ROS: mental acuity   Past Medical History  Diagnosis Date  . Hypertension    History reviewed. No pertinent past surgical history.  History reviewed. No pertinent family history.  Social History:  reports that she has never smoked. She does not have any smokeless tobacco history on file. Her alcohol and drug histories are not on file.    Allergies  Allergen Reactions  . Codeine Other (See Comments)    unknown   Medications Prior to Admission  Medication Sig Dispense Refill  . Calcium Citrate-Vitamin D (CALCIUM CITRATE + D) 315-250 MG-UNIT TABS Take 1 tablet by mouth 2 (two) times daily.       . Cholecalciferol (VITAMIN D-3) 1000 UNITS CAPS Take 1,000 Units by mouth daily.      . metoprolol tartrate (LOPRESSOR) 25 MG tablet Take 12.5 mg by mouth 3 (three) times daily.       . Multiple Vitamin (MULTIVITAMIN WITH MINERALS) TABS Take 1 tablet by mouth daily.        Home: Home Living Family/patient expects to be discharged to:: Assisted living Home Equipment: Walker - 4 wheels Additional Comments: Pt anticipates to D/C to [facility]  Functional History: Prior Function Comments: Used rollator walker for  about a week prior to coming to hospital. Pt reports she is a furniture walker at home.  Functional Status:  Mobility: Bed Mobility Bed Mobility: Rolling Right;Right Sidelying to Sit Rolling Right: 4: Min assist Right Sidelying to Sit: 4: Min assist Transfers Transfers: Sit to Stand;Stand to Sit Sit to Stand: 4: Min assist;3: Mod assist Stand to Sit: 4: Min assist Ambulation/Gait Ambulation/Gait Assistance: 3: Mod assist Ambulation Distance (Feet): 40 Feet Assistive device: Rolling walker Ambulation/Gait Assistance Details: Pt stops nearly every step and talks, unable to focus on ambulation. Cues for widdening base of support, assist needed to safely manage RW.  Gait Pattern: Decreased stride length;Narrow base of support;Trunk flexed (tandem walking, steps on toe of stance foot) Stairs: No    ADL:    Cognition: Cognition Overall Cognitive Status: No family/caregiver present to determine baseline cognitive functioning (Cognition disorganize, decr. STM, decr. awareness) Orientation Level: Oriented X4 Cognition Arousal/Alertness: Awake/alert Behavior During Therapy: WFL for tasks assessed/performed Overall Cognitive Status: No family/caregiver present to determine baseline cognitive functioning (Cognition disorganize, decr. STM, decr. awareness)  Blood pressure 123/64, pulse 123, temperature 97.5 F (36.4 C), temperature source Oral, resp. rate 16, SpO2 100.00%.  Physical Exam  Nursing note and vitals reviewed. Constitutional:  Thin elderly female  HENT:  Head: Normocephalic and atraumatic.  Eyes: Conjunctivae are normal. Pupils are equal, round, and reactive to light.  Neck: Normal range of motion.  Cardiovascular: Normal rate and regular rhythm.   Pulmonary/Chest: Effort normal and  breath sounds normal. No respiratory distress. She has no wheezes.  Abdominal: Soft. Bowel sounds are normal. She exhibits no distension. There is no tenderness.  Musculoskeletal: She exhibits  no edema and no tenderness.  Neurological: She is alert.  Oriented to self and place. Confused, rambling speech. Hard to redirect and maintain to subject.  Unable to follow simple  2 step commands.   Skin: Skin is warm and dry.  Multiple keratotic areas on face.   Psychiatric: Her mood appears anxious. Her speech is rapid and/or pressured. She expresses inappropriate judgment. She exhibits abnormal recent memory and abnormal remote memory. She is inattentive.   motor strength is 4/5 bilateral deltoid, bicep, tricep, grip, hip flexors, knee extensors, and ankle dorsiflexors and plantar flexors Sensation is normal to light touch   Results for orders placed during the hospital encounter of 10/12/12 (from the past 24 hour(s))  BASIC METABOLIC PANEL     Status: Abnormal   Collection Time    10/18/12  5:00 AM      Result Value Range   Sodium 141  135 - 145 mEq/L   Potassium 3.5  3.5 - 5.1 mEq/L   Chloride 105  96 - 112 mEq/L   CO2 29  19 - 32 mEq/L   Glucose, Bld 96  70 - 99 mg/dL   BUN 9  6 - 23 mg/dL   Creatinine, Ser 1.61  0.50 - 1.10 mg/dL   Calcium 7.8 (*) 8.4 - 10.5 mg/dL   GFR calc non Af Amer 79 (*) >90 mL/min   GFR calc Af Amer >90  >90 mL/min  GLUCOSE, CAPILLARY     Status: Abnormal   Collection Time    10/18/12  7:14 AM      Result Value Range   Glucose-Capillary 127 (*) 70 - 99 mg/dL   No results found.  Assessment/Plan: Diagnosis: Deconditioning after small bowel obstruction 1. Does the need for close, 24 hr/day medical supervision in concert with the patient's rehab needs make it unreasonable for this patient to be served in a less intensive setting? No 2. Co-Morbidities requiring supervision/potential complications: UTI 3. Due to bladder management, bowel management, safety, skin/wound care and medication administration, does the patient require 24 hr/day rehab nursing? No 4. Does the patient require coordinated care of a physician, rehab nurse, Not applicable to  address physical and functional deficits in the context of the above medical diagnosis(es)? No Addressing deficits in the following areas: balance, endurance, locomotion, strength, transferring, bowel/bladder control, bathing, dressing, feeding, grooming, toileting and cognition 5. Can the patient actively participate in an intensive therapy program of at least 3 hrs of therapy per day at least 5 days per week? Potentially 6. The potential for patient to make measurable gains while on inpatient rehab is poor 7. Anticipated functional outcomes upon discharge from inpatient rehab are supervision with PT, supervision with OT, follow two-step command with SLP. 8. Estimated rehab length of stay to reach the above functional goals is: Not applicable 9. Does the patient have adequate social supports to accommodate these discharge functional goals? Potentially 10. Anticipated D/C setting: Home 11. Anticipated post D/C treatments: HH therapy 12. Overall Rehab/Functional Prognosis: fair  RECOMMENDATIONS: This patient's condition is appropriate for continued rehabilitative care in the following setting: SNF Patient has agreed to participate in recommended program. Potentially Note that insurance prior authorization may be required for reimbursement for recommended care.  Comment: Anticipate that patient will achieve physical improvements over 5-7 days however cognition will not  improve for  Weeks.  Rec SNF to allow more time for cognition to clear.      10/18/2012

## 2012-10-19 LAB — BASIC METABOLIC PANEL WITH GFR
BUN: 7 mg/dL (ref 6–23)
CO2: 31 meq/L (ref 19–32)
Calcium: 8.2 mg/dL — ABNORMAL LOW (ref 8.4–10.5)
Chloride: 103 meq/L (ref 96–112)
Creatinine, Ser: 0.58 mg/dL (ref 0.50–1.10)
GFR calc Af Amer: >90 mL/min
GFR calc non Af Amer: 79 mL/min — ABNORMAL LOW
Glucose, Bld: 108 mg/dL — ABNORMAL HIGH (ref 70–99)
Potassium: 3.7 meq/L (ref 3.5–5.1)
Sodium: 140 meq/L (ref 135–145)

## 2012-10-19 MED ORDER — ADULT MULTIVITAMIN W/MINERALS CH
1.0000 | ORAL_TABLET | Freq: Every day | ORAL | Status: DC
  Administered 2012-10-19: 1 via ORAL
  Filled 2012-10-19: qty 1

## 2012-10-19 MED ORDER — VITAMIN D-3 25 MCG (1000 UT) PO CAPS: 1000.0000 [IU] | ORAL_CAPSULE | Freq: Every day | ORAL | Status: DC

## 2012-10-19 MED ORDER — VITAMIN D3 25 MCG (1000 UNIT) PO TABS
1000.0000 [IU] | ORAL_TABLET | Freq: Every day | ORAL | Status: DC
  Administered 2012-10-19: 1000 [IU] via ORAL
  Filled 2012-10-19: qty 1

## 2012-10-19 MED ORDER — METOPROLOL TARTRATE 12.5 MG HALF TABLET
12.5000 mg | ORAL_TABLET | Freq: Three times a day (TID) | ORAL | Status: DC
  Administered 2012-10-19: 12.5 mg via ORAL
  Filled 2012-10-19 (×3): qty 1

## 2012-10-19 NOTE — Discharge Summary (Signed)
Physician Discharge Summary  NAME:Louise ELAIN WIXON  WUJ:[id number]  DOB: [birth year]   Admit date: 10/12/2012 Discharge date: 10/19/2012  Admitting Diagnosis:   Discharge Diagnoses:  Active Hospital Problems   Diagnosis Date Noted  . Small bowel obstruction 10/13/2012  . Hypokalemia 10/17/2012  . Thrombocytopenia, unspecified 10/16/2012  . Hypertension 10/13/2012  . UTI (lower urinary tract infection) 10/13/2012  . Pulmonary nodule 10/13/2012  . Weight loss, unintentional 07/26/2012    Resolved Hospital Problems   Diagnosis Date Noted Date Resolved  No resolved problems to display.    Discharge Condition: improved  Hospital Course: patient was admitted with evidence of SBO. She was seen in consultation by the surgery service. For the first two days, an NG tube was not placed as she could not tolerate its passage. However, on the third hospital day, radiology was asked to place the tube and they were able to do so. She was kept NPO with suction by NG tube for two days. The result was that her bowels begin to open up and she began to pass flatus. Her diet was advanced.   The patient has been suffering weight loss in the six months PTA. This was mainly due to emotional upset related to estrangement with her dtr and granddtr. She was weak before admission. Her NPO status and continued bedrest resulted in further weakening. She was therefore discharged to SNF for rehab before returning to [facility]  Consults: General surgery  Disposition: 01-Home or Self Care  Discharge Orders   Future Orders Complete By Expires     Diet - low sodium heart healthy  As directed     Discharge instructions  As directed     Comments:      You may resume your calcium citrate-Vitamin D after you are out of the skilled nursing facility.    Increase activity slowly  As directed         Medication List    STOP taking these medications       CALCIUM CITRATE + D 315-250 MG-UNIT Tabs  Generic drug:   Calcium Citrate-Vitamin D      TAKE these medications       metoprolol tartrate 25 MG tablet  Commonly known as:  LOPRESSOR  Take 12.5 mg by mouth 3 (three) times daily.     multivitamin with minerals Tabs tablet  Take 1 tablet by mouth daily.     Vitamin D-3 1000 UNITS Caps  Take 1,000 Units by mouth daily.         Things to follow up in the outpatient setting: recheck platelet count upon return to office   Time coordinating discharge: 25 minutes including medication reconciliation, transmission of prescriptions to pharmacy, preparation of discharge papers, and discussion with patient     Signed: Darnelle Bos 10/19/2012, 10:39 AM

## 2012-10-19 NOTE — Progress Notes (Addendum)
Rehab MD recommends SNF as d/c plan for pt.  Gave this information via voicemail to pt's SW, Amy.  CIR will sign off.  [phone]

## 2012-10-19 NOTE — Progress Notes (Signed)
Report Called To [facility]

## 2012-10-19 NOTE — Progress Notes (Signed)
Clinical social worker assisted with patient discharge to skilled nursing facility, [facility].  CSW addressed all family questions and concerns. CSW copied chart and added all important documents. C Clinical Social Worker will sign off for now as social work intervention is no longer needed.   Sabino Niemann, MSW,  [phone]

## 2012-10-19 NOTE — Clinical Social Work Psychosocial (Signed)
Clinical Social Work Department  BRIEF PSYCHOSOCIAL ASSESSMENT  Patient: CALEAH TORTORELLI Account Number: [id number] Admit date: 10/12/12  Clinical Social Worker Sabino Niemann, MSW Date/Time: 10/17/2012 11:45 AM  Referred by: Physician Date Referred: 10/19/2012  Referred for   SNF Placement   Other Referral:  Interview type: Patient  Other interview type:  PSYCHOSOCIAL DATA  Living Status: [facility] Admitted from facility: [facility] Level of care: Independent Living Primary support name: Akasha Melena Primary support relationship to patient: Son  Degree of support available:  Strong and vested   CURRENT CONCERNS  Current Concerns   Post-Acute Placement   Other Concerns:  SOCIAL WORK ASSESSMENT / PLAN  CSW met with pt re: PT recommendation for SNF.   Pt is from [facility]  CSW explained placement process and answered questions.   Pt reports [facility] as her preference   CSW completed FL2 and initiated SNF search.     Assessment/plan status: Information/Referral to [facility]  Other assessment/ plan:  Information/referral to community resources:  SNF     PATIENT'S/FAMILY'S RESPONSE TO PLAN OF CARE:  Pt reports she is agreeable to ST SNF in order to increase strength and independence with mobility prior to returning home Pt verbalized understanding of placement process and appreciation for CSW assist.   Sabino Niemann, MSW  [phone]

## 2012-10-19 NOTE — Accreditation Note (Signed)
Patient is ready for  Discharge.  D/C instructions reviewed with patient/family. Patient being discharged to [facility].  All voice understanding to teaching. To door via wheel chair.  Transported to [facility] by family.

## 2012-10-19 NOTE — Progress Notes (Signed)
Assessment/Plan: Principal Problem:   Small bowel obstruction - this is resolved. Biggest issue is now strengthening. We will plan on SNF rehab and she is agreeable to that. I have signed yellow out of facility DNR form. FL2 will need to be generated. I can do d/c papers if d/c arrangements are made before 12 noon. Otherwise, I will not be able to do the d/c papers this afternoon (I will be traveling). I could have them ready by 8/9 AM (doing them tonight) and my on call colleague could d/c patient if bed available.  Active Problems:   Weight loss, unintentional   Hypertension   UTI (lower urinary tract infection)   Pulmonary nodule   Thrombocytopenia, unspecified   Hypokalemia - this is now normal.    Subjective: She feels fine. Had BMs and passing gas. Tolerating full liquids  Objective:  Vital Signs: Filed Vitals:   10/18/12 0544 10/18/12 1323 10/18/12 2106 10/19/12 0446  BP: 154/79 123/64 139/76 135/64  Pulse: 109 123 114 92  Temp: 98.4 F (36.9 C) 97.5 F (36.4 C) 98.2 F (36.8 C) 98.2 F (36.8 C)  TempSrc: Oral Oral    Resp: 16 16 16 14   SpO2: 97% 100% 95% 92%     EXAM: Abd negative.    Intake/Output Summary (Last 24 hours) at 10/19/12 5956 Last data filed at 10/18/12 2107  Gross per 24 hour  Intake   1360 ml  Output      2 ml  Net   1358 ml    Lab Results:  Recent Labs  10/18/12 0500 10/19/12 0500  NA 141 140  K 3.5 3.7  CL 105 103  CO2 29 31  GLUCOSE 96 108*  BUN 9 7  CREATININE 0.59 0.58  CALCIUM 7.8* 8.2*   No results found for this basename: AST, ALT, ALKPHOS, BILITOT, PROT, ALBUMIN,  in the last 72 hours No results found for this basename: LIPASE, AMYLASE,  in the last 72 hours  Recent Labs  10/16/12 0845 10/17/12 0500  WBC 4.8 4.1  HGB 16.5* 14.6  HCT 47.0* 41.9  MCV 83.3 83.1  PLT 87* 73*   No results found for this basename: CKTOTAL, CKMB, CKMBINDEX, TROPONINI,  in the last 72 hours No components found with this basename: POCBNP,   No results found for this basename: DDIMER,  in the last 72 hours No results found for this basename: HGBA1C,  in the last 72 hours No results found for this basename: CHOL, HDL, LDLCALC, TRIG, CHOLHDL, LDLDIRECT,  in the last 72 hours No results found for this basename: TSH, T4TOTAL, FREET3, T3FREE, THYROIDAB,  in the last 72 hours No results found for this basename: VITAMINB12, FOLATE, FERRITIN, TIBC, IRON, RETICCTPCT,  in the last 72 hours  Studies/Results: No results found. Medications: Medications administered in the last 24 hours reviewed.  Current Medication List reviewed.    LOS: 7 days   [facility] @ [facility] [phone]) 10/19/2012, 6:33 AM

## 2012-10-19 NOTE — Clinical Social Work Placement (Signed)
Clinical Social Work Department  CLINICAL SOCIAL WORK PLACEMENT NOTE  10/19/2012 Patient: Amy Ward  Account Number: [id number]  Admit date: 10/12/12 Clinical Social Worker: Sabino Niemann MSW Date/time: 8/8/201411:30 AM  Clinical Social Work is seeking post-discharge placement for this patient at the following level of care: SKILLED NURSING (*CSW will update this form in Epic as items are completed)  8/8/2014Patient/family provided with [facility] Department of Clinical Social Work's list of facilities offering this level of care within the geographic area requested by the patient (or if unable, by the patient's family).  10/19/2012 Patient/family informed of their freedom to choose among providers that offer the needed level of care, that participate in Medicare, Medicaid or managed care program needed by the patient, have an available bed and are willing to accept the patient.  10/19/2012 Patient/family informed of [facility]' ownership interest in [facility], as well as of the fact that they are under no obligation to receive care at this facility.  PASARR submitted to EDS on 10/19/2012 PASARR number received from EDS on 10/19/2012 FL2 transmitted to all facilities in geographic area requested by pt/family on 10/19/2012 FL2 transmitted to all facilities within larger geographic area on  Patient informed that his/her managed care company has contracts with or will negotiate with certain facilities, including the following:  Patient/family informed of bed offers received: 10/19/2012 Patient chooses bed at [facility] Physician recommends and patient chooses bed at  Patient to be transferred to on 10/19/2012 Patient to be transferred to facility by Private Vehicle The following physician request were entered in Epic:  Additional Comments:

## 2012-10-19 NOTE — Progress Notes (Signed)
  Subjective: tol diet, no n/v has bowel function  Objective: Vital signs in last 24 hours: Temp:  [97.5 F (36.4 C)-98.2 F (36.8 C)] 98.2 F (36.8 C) (08/08 0446) Pulse Rate:  [92-123] 92 (08/08 0446) Resp:  [14-16] 14 (08/08 0446) BP: (123-139)/(64-76) 135/64 mmHg (08/08 0446) SpO2:  [92 %-100 %] 92 % (08/08 0446) Last BM Date: 10/18/12  Intake/Output from previous day: 08/07 0701 - 08/08 0700 In: 1360 [P.O.:1360] Out: 2 [Urine:1; Stool:1] Intake/Output this shift:    GI: soft nontender nondistended  Lab Results:   Recent Labs  10/17/12 0500  WBC 4.1  HGB 14.6  HCT 41.9  PLT 73*   BMET  Recent Labs  10/18/12 0500 10/19/12 0500  NA 141 140  K 3.5 3.7  CL 105 103  CO2 29 31  GLUCOSE 96 108*  BUN 9 7  CREATININE 0.59 0.58  CALCIUM 7.8* 8.2*   PT/INR No results found for this basename: LABPROT, INR,  in the last 72 hours ABG No results found for this basename: PHART, PCO2, PO2, HCO3,  in the last 72 hours  Studies/Results: No results found.  Anti-infectives: Anti-infectives   Start     Dose/Rate Route Frequency Ordered Stop   10/13/12 2200  cefTRIAXone (ROCEPHIN) 1 g in dextrose 5 % 50 mL IVPB  Status:  Discontinued     1 g 100 mL/hr over 30 Minutes Intravenous Every 24 hours 10/13/12 0647 10/14/12 1052   10/13/12 0200  cefTRIAXone (ROCEPHIN) 1 g in dextrose 5 % 50 mL IVPB     1 g 100 mL/hr over 30 Minutes Intravenous  Once 10/13/12 0153 10/13/12 1610      Assessment/Plan: Resolved sbo  Regular diet, can dc when able, will sign off, discussed risk of recurrence but no further evaluation or therapy needed  [facility] 10/19/2012

## 2012-10-22 ENCOUNTER — Non-Acute Institutional Stay (SKILLED_NURSING_FACILITY): Payer: Medicare Other | Admitting: Internal Medicine

## 2012-10-22 DIAGNOSIS — E876 Hypokalemia: Secondary | ICD-10-CM

## 2012-10-22 DIAGNOSIS — K56609 Unspecified intestinal obstruction, unspecified as to partial versus complete obstruction: Secondary | ICD-10-CM

## 2012-10-22 DIAGNOSIS — D696 Thrombocytopenia, unspecified: Secondary | ICD-10-CM

## 2012-10-22 DIAGNOSIS — I10 Essential (primary) hypertension: Secondary | ICD-10-CM

## 2012-10-23 ENCOUNTER — Non-Acute Institutional Stay (SKILLED_NURSING_FACILITY): Payer: Medicare Other | Admitting: Adult Health

## 2012-10-23 DIAGNOSIS — R21 Rash and other nonspecific skin eruption: Secondary | ICD-10-CM

## 2012-10-23 DIAGNOSIS — K219 Gastro-esophageal reflux disease without esophagitis: Secondary | ICD-10-CM

## 2012-11-01 ENCOUNTER — Encounter: Payer: Self-pay | Admitting: Adult Health

## 2012-11-01 ENCOUNTER — Non-Acute Institutional Stay (SKILLED_NURSING_FACILITY): Payer: Medicare Other | Admitting: Adult Health

## 2012-11-01 DIAGNOSIS — K219 Gastro-esophageal reflux disease without esophagitis: Secondary | ICD-10-CM

## 2012-11-01 DIAGNOSIS — G4700 Insomnia, unspecified: Secondary | ICD-10-CM

## 2012-11-01 DIAGNOSIS — R21 Rash and other nonspecific skin eruption: Secondary | ICD-10-CM | POA: Insufficient documentation

## 2012-11-01 DIAGNOSIS — R5381 Other malaise: Secondary | ICD-10-CM

## 2012-11-01 DIAGNOSIS — I10 Essential (primary) hypertension: Secondary | ICD-10-CM

## 2012-11-01 DIAGNOSIS — R531 Weakness: Secondary | ICD-10-CM | POA: Insufficient documentation

## 2012-11-01 NOTE — Progress Notes (Signed)
Patient ID: Amy Ward, female   DOB: 1921/04/09, 79 y.o.   MRN: [id number]       PROGRESS NOTE  DATE: 11/01/2012   FACILITY: [facility]  LEVEL OF CARE: SNF (31)   CHIEF COMPLAINT:  Discharge Visit   HISTORY OF PRESENT ILLNESS: This is an 79 year old female who is for discharge to [facility] with Home health CNA, PT, OT and Nursing. She has been admitted to [facility] on 10/19/12 from [facility] with discharge diagnoses of small bowel obstruction which has been resolved. Patient was admitted to this facility due to generalized weakness  after the patient's recent hospitalization.  Patient has completed SNF rehabilitation and therapy has cleared the patient for discharge.  Reassessment of ongoing problem(s):  HTN: Pt 's HTN remains stable.  Denies CP, sob, DOE, pedal edema, headaches, dizziness or visual disturbances.  No complications from the medications currently being used.  Last BP : 126/68  GERD: pt's GERD is stable.  Denies ongoing heartburn, abd. Pain, nausea or vomiting.  Currently on a PPI & tolerates it without any adverse reactions.  PAST MEDICAL HISTORY : Reviewed.  No changes.  CURRENT MEDICATIONS: Reviewed per [facility]  REVIEW OF SYSTEMS:  GENERAL: no change in appetite, no fatigue, no weight changes, no fever, chills or weakness RESPIRATORY: no cough, SOB, DOE, wheezing, hemoptysis CARDIAC: no chest pain, edema or palpitations GI: no abdominal pain, diarrhea, constipation, heart burn, nausea or vomiting  PHYSICAL EXAMINATION  VS:  T 98.7       P 78       RR 20      BP 126/68            W 92.6 T (Lb)  GENERAL: no acute distress, normal body habitus EYES: conjunctivae normal, sclerae normal, normal eye lids NECK: supple, trachea midline, no neck masses, no thyroid tenderness, no thyromegaly LYMPHATICS: no LAN in the neck, no supraclavicular LAN RESPIRATORY: breathing is even & unlabored, BS CTAB CARDIAC: RRR,  no murmur,no extra heart sounds, no edema GI: abdomen soft, normal BS, no masses, no tenderness, no hepatomegaly, no splenomegaly PSYCHIATRIC: the patient is alert & oriented to person, affect & behavior appropriate  LABS/RADIOLOGY: 10/19/12 sodium 140 potassium 3.7 glucose 108 BUN 7 creatinine 0.58 calcium 8.2 10/17/12 WBC 4.1 hemoglobin 14.6 hematocrit 41.9  ASSESSMENT/PLAN:  Generalized weakness - will have home health CNA, PT, OT and Nursing  Hypertension - well-controlled  Insomnia - no complaints  GERD - stable  I have filled out patient's discharge paperwork and written prescriptions.  Patient will receive home health PT, OT, nursing and CNA.  Total discharge time: Less than 30 minutes Discharge time involved coordination of the discharge process with Child psychotherapist, nursing staff and therapy department. Medical justification for home health services verified.  CPT CODE: 40981

## 2012-11-01 NOTE — Progress Notes (Signed)
Patient ID: KHAMILA BASSINGER, female   DOB: 1922/02/29, 79 y.o.   MRN: [id number]       PROGRESS NOTE  DATE: 10/23/2012  FACILITY:  [facility]  LEVEL OF CARE: SNF (31)  Acute Visit  CHIEF COMPLAINT:  Manage GERD and Rashes  HISTORY OF PRESENT ILLNESS: This is an 79 year old female who complains of having nausea and burning sensation on her chest. She is not cold nor clammy. No shortness of breath. She's also noted to have erythematous rashes on her back.  PAST MEDICAL HISTORY : Reviewed.  No changes.  CURRENT MEDICATIONS: Reviewed per [facility]  REVIEW OF SYSTEMS:  GENERAL: no change in appetite, no fatigue, no weight changes, no fever, chills or weakness RESPIRATORY: no cough, SOB, DOE,, wheezing, hemoptysis CARDIAC: no chest pain, edema or palpitations GI: no abdominal pain, diarrhea, constipation, + heart burn,+ nausea   PHYSICAL EXAMINATION  VS:  T98.5        P64       RR21       BP113/63            WT95.8 (Lb)  GENERAL: no acute distress, normal body habitus EYES: conjunctivae normal, sclerae normal, normal eye lids NECK: supple, trachea midline, no neck masses, no thyroid tenderness, no thyromegaly LYMPHATICS: no LAN in the neck, no supraclavicular LAN RESPIRATORY: breathing is even & unlabored, BS CTAB CARDIAC: RRR, no murmur,no extra heart sounds, no edema GI: abdomen soft, normal BS, no masses, no tenderness, no hepatomegaly, no splenomegaly PSYCHIATRIC: the patient is alert & oriented to person, affect & behavior appropriate  LABS/RADIOLOGY: 10/19/12 sodium 140 potassium 3.7 glucose 108 BUN 7 creatinine 0.58 calcium 8.2 10/17/12 WBC 4.1 hemoglobin 14.6 hematocrit 41.9  ASSESSMENT/PLAN:  GERD - start Prilosec 20 mg one by mouth daily  Rashes on back - start hydrocortisone 1% cream topically to rashes on back twice a day x2 weeks  CPT CODE: 53664

## 2012-11-13 NOTE — Progress Notes (Signed)
Patient ID: Amy Ward, female   DOB: 09/05/1921, 79 y.o.   MRN: [id number]        HISTORY & PHYSICAL  DATE: 10/22/2012   FACILITY: [facility]  LEVEL OF CARE: SNF (31)  ALLERGIES:  Allergies  Allergen Reactions  . Codeine Other (See Comments)    unknown    CHIEF COMPLAINT:  Manage hypertension, small bowel obstruction, and hypokalemia.    HISTORY OF PRESENT ILLNESS:  The patient is an 79 year-old, Caucasian female who was hospitalized for small bowel obstruction.  The patient was managed conservatively and the bowel obstruction resolved.  Therefore, she is admitted to this facility for short-term rehabilitation.  She denies abdominal pain, nausea or vomiting.    HTN: Pt 's HTN remains stable.  Denies CP, sob, DOE, pedal edema, headaches, dizziness or visual disturbances.  No complications from the medications currently being used.  Last BP :  166/80, 158/84, 113/63.    HYPOKALEMIA: The patient's hypokalemia remains stable. Her hypokalemia was repleted in the hospital.  Patient denies muscle cramping or palpitations. The patient is  currently not on potassium supplementation.  Last potassium level:  3.6.    PAST MEDICAL HISTORY :  Past Medical History  Diagnosis Date  . Hypertension     PAST SURGICAL HISTORY: None  SOCIAL HISTORY:  reports that she has never smoked. She does not have any smokeless tobacco history on file.  FAMILY HISTORY: None  CURRENT MEDICATIONS: Reviewed per [facility]  REVIEW OF SYSTEMS:  See HPI otherwise 14 point ROS is negative.  PHYSICAL EXAMINATION  VS:  T 98.5       P 86      RR 18      BP 166/80      POX%        WT (Lb)  GENERAL: no acute distress, thin body habitus SKIN: warm & dry, no suspicious lesions or rashes, no excessive dryness EYES: conjunctivae normal, sclerae normal, normal eye lids MOUTH/THROAT: lips without lesions,no lesions in the mouth,tongue is without lesions,uvula elevates in midline NECK: supple, trachea  midline, no neck masses, no thyroid tenderness, no thyromegaly LYMPHATICS: no LAN in the neck, no supraclavicular LAN RESPIRATORY: breathing is even & unlabored, BS CTAB CARDIAC: RRR, no murmur,no extra heart sounds, no edema GI:  ABDOMEN: abdomen soft, normal BS, no masses, no tenderness  LIVER/SPLEEN: no hepatomegaly, no splenomegaly MUSCULOSKELETAL: HEAD: normal to inspection & palpation BACK: no kyphosis, scoliosis or spinal processes tenderness EXTREMITIES: LEFT UPPER EXTREMITY: full range of motion, normal strength & tone RIGHT UPPER EXTREMITY:  full range of motion, normal strength & tone LEFT LOWER EXTREMITY: strength intact, range of motion moderate  RIGHT LOWER EXTREMITY: strength intact, range of motion moderate  PSYCHIATRIC: the patient is alert & oriented to person, affect & behavior appropriate  LABS/RADIOLOGY: Platelets 73, otherwise CBC normal.    Glucose 108, otherwise BMP normal.    ASSESSMENT/PLAN:  Small bowel obstruction.  No further symptoms.    Hypertension.  Blood pressures variable.  We will monitor.    Hypokalemia.  Well repleted.  Reassess.     Thrombocytopenia.  The patient is asymptomatic.  We will reassess.    Check CBC and BMP.    I have reviewed patient's medical records received at admission/from hospitalization.  CPT CODE: 69629

## 2013-09-04 ENCOUNTER — Ambulatory Visit
Admission: RE | Admit: 2013-09-04 | Discharge: 2013-09-04 | Disposition: A | Discharge status: Home / Self Care | Payer: Medicare Other | Source: Ambulatory Visit | Attending: Internal Medicine | Admitting: Internal Medicine

## 2013-09-04 ENCOUNTER — Other Ambulatory Visit: Payer: Self-pay | Admitting: Internal Medicine

## 2013-09-04 DIAGNOSIS — R0602 Shortness of breath: Secondary | ICD-10-CM

## 2013-09-04 DIAGNOSIS — M25512 Pain in left shoulder: Secondary | ICD-10-CM

## 2014-04-12 ENCOUNTER — Encounter (HOSPITAL_COMMUNITY): Payer: Self-pay | Admitting: Emergency Medicine

## 2014-04-12 ENCOUNTER — Emergency Department (HOSPITAL_COMMUNITY)
Admission: EM | Admit: 2014-04-12 | Discharge: 2014-04-12 | Disposition: A | Discharge status: Home / Self Care | Payer: Medicare Other | Attending: Emergency Medicine | Admitting: Emergency Medicine

## 2014-04-12 ENCOUNTER — Emergency Department (HOSPITAL_COMMUNITY): Payer: Medicare Other

## 2014-04-12 DIAGNOSIS — Y998 Other external cause status: Secondary | ICD-10-CM | POA: Insufficient documentation

## 2014-04-12 DIAGNOSIS — S0292XA Unspecified fracture of facial bones, initial encounter for closed fracture: Secondary | ICD-10-CM

## 2014-04-12 DIAGNOSIS — Y9289 Other specified places as the place of occurrence of the external cause: Secondary | ICD-10-CM | POA: Insufficient documentation

## 2014-04-12 DIAGNOSIS — N189 Chronic kidney disease, unspecified: Secondary | ICD-10-CM | POA: Diagnosis not present

## 2014-04-12 DIAGNOSIS — S0101XA Laceration without foreign body of scalp, initial encounter: Secondary | ICD-10-CM | POA: Insufficient documentation

## 2014-04-12 DIAGNOSIS — Z23 Encounter for immunization: Secondary | ICD-10-CM | POA: Insufficient documentation

## 2014-04-12 DIAGNOSIS — T148XXA Other injury of unspecified body region, initial encounter: Secondary | ICD-10-CM

## 2014-04-12 DIAGNOSIS — Y9389 Activity, other specified: Secondary | ICD-10-CM | POA: Insufficient documentation

## 2014-04-12 DIAGNOSIS — Z79899 Other long term (current) drug therapy: Secondary | ICD-10-CM | POA: Insufficient documentation

## 2014-04-12 DIAGNOSIS — S0990XA Unspecified injury of head, initial encounter: Secondary | ICD-10-CM

## 2014-04-12 DIAGNOSIS — W01198A Fall on same level from slipping, tripping and stumbling with subsequent striking against other object, initial encounter: Secondary | ICD-10-CM | POA: Diagnosis not present

## 2014-04-12 DIAGNOSIS — W19XXXA Unspecified fall, initial encounter: Secondary | ICD-10-CM

## 2014-04-12 DIAGNOSIS — I129 Hypertensive chronic kidney disease with stage 1 through stage 4 chronic kidney disease, or unspecified chronic kidney disease: Secondary | ICD-10-CM | POA: Diagnosis not present

## 2014-04-12 MED ORDER — TETANUS-DIPHTH-ACELL PERTUSSIS 5-2.5-18.5 LF-MCG/0.5 IM SUSP
0.5000 mL | Freq: Once | INTRAMUSCULAR | Status: AC
  Administered 2014-04-12: 0.5 mL via INTRAMUSCULAR
  Filled 2014-04-12: qty 0.5

## 2014-04-12 MED ORDER — AMOXICILLIN 500 MG PO CAPS: 500.0000 mg | ORAL_CAPSULE | Freq: Three times a day (TID) | ORAL | Status: AC

## 2014-04-12 NOTE — Discharge Instructions (Signed)
Ice to the face for 20 min at a time several times a day. Tylenol for pain. Keep lacerations clean and dry. Bacitracin topically. Amoxicillin to prevent infection from the facial fracture. Follow up with Dr. Emeline DarlingGore, ENT for recheck. Follow up with primary care doctor for staple removal in 1 week.   Skin Tear Care A skin tear is a wound in which the top layer of skin has peeled off. This is a common problem with aging because the skin becomes thinner and more fragile as a person gets older. In addition, some medicines, such as oral corticosteroids, can lead to skin thinning if taken for long periods of time.  A skin tear is often repaired with tape or skin adhesive strips. This keeps the skin that has been peeled off in contact with the healthier skin beneath. Depending on the location of the wound, a bandage (dressing) may be applied over the tape or skin adhesive strips. Sometimes, during the healing process, the skin turns black and dies. Even when this happens, the torn skin acts as a good dressing until the skin underneath gets healthier and repairs itself. HOME CARE INSTRUCTIONS   Change dressings once per day or as directed by your caregiver.  Gently clean the skin tear and the area around the tear using saline solution or mild soap and water.  Do not rub the injured skin dry. Let the area air dry.  Apply petroleum jelly or an antibiotic cream or ointment to keep the tear moist. This will help the wound heal. Do not allow a scab to form.  If the dressing sticks before the next dressing change, moisten it with warm soapy water and gently remove it.  Protect the injured skin until it has healed.  Only take over-the-counter or prescription medicines as directed by your caregiver.  Take showers or baths using warm soapy water. Apply a new dressing after the shower or bath.  Keep all follow-up appointments as directed by your caregiver.  SEEK IMMEDIATE MEDICAL CARE IF:   You have redness,  swelling, or increasing pain in the skin tear.  You havepus coming from the skin tear.  You have chills.  You have a red streak that goes away from the skin tear.  You have a bad smell coming from the tear or dressing.  You have a fever or persistent symptoms for more than 2-3 days.  You have a fever and your symptoms suddenly get worse. MAKE SURE YOU:  Understand these instructions.  Will watch this condition.  Will get help right away if your child is not doing well or gets worse. Document Released: 11/23/2000 Document Revised: 11/23/2011 Document Reviewed: 09/12/2011 [facility] Patient Information 2015 [place], [place]. This information is not intended to replace advice given to you by your health care provider. Make sure you discuss any questions you have with your health care provider.  Head Injury You have received a head injury. It does not appear serious at this time. Headaches and vomiting are common following head injury. It should be easy to awaken from sleeping. Sometimes it is necessary for you to stay in the emergency department for a while for observation. Sometimes admission to the hospital may be needed. After injuries such as yours, most problems occur within the first 24 hours, but side effects may occur up to 7-10 days after the injury. It is important for you to carefully monitor your condition and contact your health care provider or seek immediate medical care if there is  a change in your condition. WHAT ARE THE TYPES OF HEAD INJURIES? Head injuries can be as minor as a bump. Some head injuries can be more severe. More severe head injuries include:  A jarring injury to the brain (concussion).  A bruise of the brain (contusion). This mean there is bleeding in the brain that can cause swelling.  A cracked skull (skull fracture).  Bleeding in the brain that collects, clots, and forms a bump (hematoma). WHAT CAUSES A HEAD INJURY? A serious head injury is most  likely to happen to someone who is in a car wreck and is not wearing a seat belt. Other causes of major head injuries include bicycle or motorcycle accidents, sports injuries, and falls. HOW ARE HEAD INJURIES DIAGNOSED? A complete history of the event leading to the injury and your current symptoms will be helpful in diagnosing head injuries. Many times, pictures of the brain, such as CT or MRI are needed to see the extent of the injury. Often, an overnight hospital stay is necessary for observation.  WHEN SHOULD I SEEK IMMEDIATE MEDICAL CARE?  You should get help right away if:  You have confusion or drowsiness.  You feel sick to your stomach (nauseous) or have continued, forceful vomiting.  You have dizziness or unsteadiness that is getting worse.  You have severe, continued headaches not relieved by medicine. Only take over-the-counter or prescription medicines for pain, fever, or discomfort as directed by your health care provider.  You do not have normal function of the arms or legs or are unable to walk.  You notice changes in the black spots in the center of the colored part of your eye (pupil).  You have a clear or bloody fluid coming from your nose or ears.  You have a loss of vision. During the next 24 hours after the injury, you must stay with someone who can watch you for the warning signs. This person should contact local emergency services (911 in the [place] if you have seizures, you become unconscious, or you are unable to wake up. HOW CAN I PREVENT A HEAD INJURY IN THE FUTURE? The most important factor for preventing major head injuries is avoiding motor vehicle accidents. To minimize the potential for damage to your head, it is crucial to wear seat belts while riding in motor vehicles. Wearing helmets while bike riding and playing collision sports (like football) is also helpful. Also, avoiding dangerous activities around the house will further help reduce your risk of head  injury.  WHEN CAN I RETURN TO NORMAL ACTIVITIES AND ATHLETICS? You should be reevaluated by your health care provider before returning to these activities. If you have any of the following symptoms, you should not return to activities or contact sports until 1 week after the symptoms have stopped:  Persistent headache.  Dizziness or vertigo.  Poor attention and concentration.  Confusion.  Memory problems.  Nausea or vomiting.  Fatigue or tire easily.  Irritability.  Intolerant of bright lights or loud noises.  Anxiety or depression.  Disturbed sleep. MAKE SURE YOU:   Understand these instructions.  Will watch your condition.  Will get help right away if you are not doing well or get worse. Document Released: 02/28/2005 Document Revised: 03/05/2013 Document Reviewed: 11/05/2012 [facility] Patient Information 2015 [place], [place]. This information is not intended to replace advice given to you by your health care provider. Make sure you discuss any questions you have with your health care provider.

## 2014-04-12 NOTE — ED Notes (Signed)
Laceration noted to right side of head.  Large Skin tear to right forearm and right side of face.

## 2014-04-12 NOTE — ED Notes (Signed)
PA at bedside.

## 2014-04-12 NOTE — ED Notes (Signed)
Skin tears present to the right side of face. Swelling noted, bleeding controlled. Wound covered with Guaze dressing.

## 2014-04-12 NOTE — ED Notes (Addendum)
Pt fell today and hit face on the walker. Has 2 facial lacerations/skin tears. Swelling noted on right side of the face. Also has right forearm skin tear. Bleeding is minimal at this time. Denies dizziness upon standing. Does not take blood thinners at this time. Also recently had shingles approximately 6 months ago. Was not treated with Acyclovir. No other issues/concerned. Speaking full/clear sentences. RR even/unlabored. Unsure of last tetanus shot.

## 2014-04-12 NOTE — ED Notes (Signed)
Pt [place]. Pt was advised to follow up with PCP in 1 week for suture removal.

## 2014-04-12 NOTE — ED Notes (Signed)
Wound cleansed with sterile saline and gauze.

## 2014-04-12 NOTE — ED Notes (Signed)
Pt son's number  Christen BameRonnie his number is [phone]

## 2014-04-12 NOTE — ED Provider Notes (Signed)
CSN: [id number]     Arrival date & time 04/12/14  1400 History   First MD Initiated Contact with Patient 04/12/14 1617     Chief Complaint  Patient presents with  . Fall  . Facial Laceration     (Consider location/radiation/quality/duration/timing/severity/associated sxs/prior Treatment) HPI Amy Ward is a 81 y.o. female with hx of htn, CKD, presents to ED after a fall. Pt states she was sitting on side of the bed, states slipped and fell forward, hitting her head on her walker and then on the floor. Pt reports no LOC. No headache. Several skin tears to the face and right forearm. Denies arm pain. Denies feeling dizzy, lightheaded. No visual changes. No n/v. Last tetanus unknown. Pressure applied to skin tears for bleeding. Not on anticoagulation.   Past Medical History  Diagnosis Date  . Hypertension   . CKD (chronic kidney disease)    History reviewed. No pertinent past surgical history. History reviewed. No pertinent family history. History  Substance Use Topics  . Smoking status: Never Smoker   . Smokeless tobacco: Not on file  . Alcohol Use: Not on file   OB History    No data available     Review of Systems  Constitutional: Negative for fever and chills.  Respiratory: Negative for cough, chest tightness and shortness of breath.   Cardiovascular: Negative for chest pain, palpitations and leg swelling.  Gastrointestinal: Negative for nausea, vomiting, abdominal pain and diarrhea.  Genitourinary: Negative for dysuria, flank pain and pelvic pain.  Musculoskeletal: Negative for myalgias, arthralgias, neck pain and neck stiffness.  Skin: Positive for wound. Negative for rash.  Neurological: Negative for dizziness, weakness and headaches.  All other systems reviewed and are negative.     Allergies  Codeine  Home Medications   Prior to Admission medications   Medication Sig Start Date End Date Taking? Authorizing Provider  Calcium Citrate-Vitamin D (CITRACAL  MAXIMUM) 315-250 MG-UNIT TABS Take 1 tablet by mouth daily.    Yes Historical Provider, MD  Cholecalciferol (VITAMIN D-3) 1000 UNITS CAPS Take 1,000 Units by mouth daily.   Yes Historical Provider, MD  metoprolol tartrate (LOPRESSOR) 25 MG tablet Take 12.5 mg by mouth 3 (three) times daily.    Yes Historical Provider, MD   BP 154/68 mmHg  Pulse 94  Temp(Src) 97 F (36.1 C) (Oral)  Resp 17  SpO2 100% Physical Exam  Constitutional: She is oriented to person, place, and time. She appears well-developed and well-nourished. No distress.  HENT:  Head: Normocephalic.  Swelling noted to the right maxilla, right mandible with significant hematoma over right mandible. Full rom of the mandilbe.   Eyes: Conjunctivae are normal.  Neck: Neck supple.  Cardiovascular: Normal rate, regular rhythm and normal heart sounds.   Pulmonary/Chest: Effort normal and breath sounds normal. No respiratory distress. She has no wheezes. She has no rales.  Abdominal: Soft. Bowel sounds are normal. She exhibits no distension. There is no tenderness. There is no rebound.  Musculoskeletal: She exhibits no edema.  Neurological: She is alert and oriented to person, place, and time.  Skin: Skin is warm and dry.  Psychiatric: She has a normal mood and affect. Her behavior is normal.  Nursing note and vitals reviewed.   ED Course  Procedures (including critical care time) Labs Review Labs Reviewed - No data to display  Imaging Review Ct Head Wo Contrast  04/12/2014   CLINICAL DATA:  Pt fell today and hit face on the walker. Has 2  facial lacerations/skin tears. Swelling noted on right side of the face. Also has right forearm skin tear. Bleeding is minimal at this time. Denies dizziness upon standing. Does not take blood thinners at this time. Also recently had shingles approximately 6 months ago. Was not treated with Acyclovir. No other issues/concerned. Speaking full/clear sentences.  EXAM: CT HEAD WITHOUT CONTRAST  CT  MAXILLOFACIAL WITHOUT CONTRAST  CT CERVICAL SPINE WITHOUT CONTRAST  TECHNIQUE: Multidetector CT imaging of the head, cervical spine, and maxillofacial structures were performed using the standard protocol without intravenous contrast. Multiplanar CT image reconstructions of the cervical spine and maxillofacial structures were also generated.  COMPARISON:  Brain MRI and head CT, 05/20/2010  FINDINGS: CT HEAD FINDINGS  Ventricles are normal in configuration. There is ventricular sulcal enlargement reflecting age related volume loss.  No parenchymal masses or mass effect. There is no evidence of a transcortical infarct. Patchy areas of white matter hypoattenuation noted consistent with mild chronic microvascular ischemic change.  There are no extra-axial masses or abnormal fluid collections.  There is no intracranial hemorrhage.  No skull fracture. There is an area of sclerosis in the left frontal bone. This was present on the prior head CT and is unchanged. Mastoid air cells are clear.  CT MAXILLOFACIAL FINDINGS  There is a nondisplaced fracture of the lateral wall right maxillary sinus. Dependent hemorrhage lies within the right maxillary sinus.  No other fracture.  Remaining sinuses are clear.  There is significant soft tissue edema along the right cheek extending to the right mandibular region.  No soft tissue masses or enlarged lymph nodes. Airway is widely patent.  CT CERVICAL SPINE FINDINGS  No fracture.  No spondylolisthesis.  Bones are diffusely demineralized. There are degenerative changes throughout the cervical spine with loss of disc height and endplate spurring. This is most evident at C5-C6 and C6-C7. There are facet degenerative changes bilaterally. Varying degrees of neural foraminal narrowing are noted.  Soft tissues are unremarkable. Lung apices show scarring but are otherwise clear.  IMPRESSION: HEAD CT:  No acute intracranial abnormality.  No skull fracture.  MAXILLOFACIAL CT: Nondisplaced fracture  of the lateral wall of the right maxillary sinus with dependent hemorrhage within the right maxillary sinus. No other fractures. Right facial soft tissue contusion.  CERVICAL CT:  No fracture or acute finding.   Electronically Signed   By: Amie Portland M.D.   On: 04/12/2014 17:24   Ct Cervical Spine Wo Contrast  04/12/2014   CLINICAL DATA:  Pt fell today and hit face on the walker. Has 2 facial lacerations/skin tears. Swelling noted on right side of the face. Also has right forearm skin tear. Bleeding is minimal at this time. Denies dizziness upon standing. Does not take blood thinners at this time. Also recently had shingles approximately 6 months ago. Was not treated with Acyclovir. No other issues/concerned. Speaking full/clear sentences.  EXAM: CT HEAD WITHOUT CONTRAST  CT MAXILLOFACIAL WITHOUT CONTRAST  CT CERVICAL SPINE WITHOUT CONTRAST  TECHNIQUE: Multidetector CT imaging of the head, cervical spine, and maxillofacial structures were performed using the standard protocol without intravenous contrast. Multiplanar CT image reconstructions of the cervical spine and maxillofacial structures were also generated.  COMPARISON:  Brain MRI and head CT, 05/20/2010  FINDINGS: CT HEAD FINDINGS  Ventricles are normal in configuration. There is ventricular sulcal enlargement reflecting age related volume loss.  No parenchymal masses or mass effect. There is no evidence of a transcortical infarct. Patchy areas of white matter hypoattenuation noted consistent with  mild chronic microvascular ischemic change.  There are no extra-axial masses or abnormal fluid collections.  There is no intracranial hemorrhage.  No skull fracture. There is an area of sclerosis in the left frontal bone. This was present on the prior head CT and is unchanged. Mastoid air cells are clear.  CT MAXILLOFACIAL FINDINGS  There is a nondisplaced fracture of the lateral wall right maxillary sinus. Dependent hemorrhage lies within the right maxillary  sinus.  No other fracture.  Remaining sinuses are clear.  There is significant soft tissue edema along the right cheek extending to the right mandibular region.  No soft tissue masses or enlarged lymph nodes. Airway is widely patent.  CT CERVICAL SPINE FINDINGS  No fracture.  No spondylolisthesis.  Bones are diffusely demineralized. There are degenerative changes throughout the cervical spine with loss of disc height and endplate spurring. This is most evident at C5-C6 and C6-C7. There are facet degenerative changes bilaterally. Varying degrees of neural foraminal narrowing are noted.  Soft tissues are unremarkable. Lung apices show scarring but are otherwise clear.  IMPRESSION: HEAD CT:  No acute intracranial abnormality.  No skull fracture.  MAXILLOFACIAL CT: Nondisplaced fracture of the lateral wall of the right maxillary sinus with dependent hemorrhage within the right maxillary sinus. No other fractures. Right facial soft tissue contusion.  CERVICAL CT:  No fracture or acute finding.   Electronically Signed   By: Amie Portland M.D.   On: 04/12/2014 17:24   Ct Maxillofacial Wo Cm  04/12/2014   CLINICAL DATA:  Pt fell today and hit face on the walker. Has 2 facial lacerations/skin tears. Swelling noted on right side of the face. Also has right forearm skin tear. Bleeding is minimal at this time. Denies dizziness upon standing. Does not take blood thinners at this time. Also recently had shingles approximately 6 months ago. Was not treated with Acyclovir. No other issues/concerned. Speaking full/clear sentences.  EXAM: CT HEAD WITHOUT CONTRAST  CT MAXILLOFACIAL WITHOUT CONTRAST  CT CERVICAL SPINE WITHOUT CONTRAST  TECHNIQUE: Multidetector CT imaging of the head, cervical spine, and maxillofacial structures were performed using the standard protocol without intravenous contrast. Multiplanar CT image reconstructions of the cervical spine and maxillofacial structures were also generated.  COMPARISON:  Brain MRI  and head CT, 05/20/2010  FINDINGS: CT HEAD FINDINGS  Ventricles are normal in configuration. There is ventricular sulcal enlargement reflecting age related volume loss.  No parenchymal masses or mass effect. There is no evidence of a transcortical infarct. Patchy areas of white matter hypoattenuation noted consistent with mild chronic microvascular ischemic change.  There are no extra-axial masses or abnormal fluid collections.  There is no intracranial hemorrhage.  No skull fracture. There is an area of sclerosis in the left frontal bone. This was present on the prior head CT and is unchanged. Mastoid air cells are clear.  CT MAXILLOFACIAL FINDINGS  There is a nondisplaced fracture of the lateral wall right maxillary sinus. Dependent hemorrhage lies within the right maxillary sinus.  No other fracture.  Remaining sinuses are clear.  There is significant soft tissue edema along the right cheek extending to the right mandibular region.  No soft tissue masses or enlarged lymph nodes. Airway is widely patent.  CT CERVICAL SPINE FINDINGS  No fracture.  No spondylolisthesis.  Bones are diffusely demineralized. There are degenerative changes throughout the cervical spine with loss of disc height and endplate spurring. This is most evident at C5-C6 and C6-C7. There are facet degenerative changes bilaterally.  Varying degrees of neural foraminal narrowing are noted.  Soft tissues are unremarkable. Lung apices show scarring but are otherwise clear.  IMPRESSION: HEAD CT:  No acute intracranial abnormality.  No skull fracture.  MAXILLOFACIAL CT: Nondisplaced fracture of the lateral wall of the right maxillary sinus with dependent hemorrhage within the right maxillary sinus. No other fractures. Right facial soft tissue contusion.  CERVICAL CT:  No fracture or acute finding.   Electronically Signed   By: Amie Portlandavid  Ormond M.D.   On: 04/12/2014 17:24    LACERATION REPAIR Performed by: Jaynie CrumbleKIRICHENKO, Satvik Parco A Authorized by:  Jaynie CrumbleKIRICHENKO, Aldrick Derrig A Consent: Verbal consent obtained. Risks and benefits: risks, benefits and alternatives were discussed Consent given by: patient Patient identity confirmed: provided demographic data Prepped and Draped in normal sterile fashion Wound explored  Laceration Location: right scalp  Laceration Length: 1cm  No Foreign Bodies seen or palpated  Anesthesia: none  Irrigation method: syringe Amount of cleaning: standard  Skin closure: surgical staple  Number of sutures: 1  Patient tolerance: Patient tolerated the procedure well with no immediate complications.   MDM   Final diagnoses:  Facial fracture due to fall, closed, initial encounter  Fall, initial encounter  Minor head injury, initial encounter  Laceration of scalp, initial encounter  Multiple skin tears    Pt with mechanical fall. Laceration to right scalp, two skin tears to the right face, hematoma to the face, skin tear to right forearm. WIll update tetanus. CT head, maxillofacial, cervical spine ordered   Skin tears to the face and right forearm clean extensively with saline. Skin pulled over the tears and repaired with sterri strips. Sterile gauze applied. Scalp lac repaired with a staple. CTs showed non displaced right maxillary sinus fracture. Will place on amoxicillin. Ice at home. Follow up with ENT. Pt's CTs otherwise negative. Discussed with Dr. Radford PaxBeaton who agrees.   Filed Vitals:   04/12/14 1420  BP: 154/68  Pulse: 94  Temp: 97 F (36.1 C)  TempSrc: Oral  Resp: 17  SpO2: 100%      Lottie Musselatyana A Vander Kueker, PA-C 04/13/14 0015  Nelia Shiobert L Beaton, MD 04/13/14 2303

## 2014-04-12 NOTE — ED Notes (Signed)
Pt had episode of urinary incontinence. PT assisted to restroom and clean dry clothes and pamper applied.

## 2014-09-05 ENCOUNTER — Inpatient Hospital Stay (HOSPITAL_COMMUNITY): Payer: Medicare Other | Admitting: Anesthesiology

## 2014-09-05 ENCOUNTER — Emergency Department (HOSPITAL_COMMUNITY): Payer: Medicare Other

## 2014-09-05 ENCOUNTER — Inpatient Hospital Stay (HOSPITAL_COMMUNITY): Payer: Medicare Other

## 2014-09-05 ENCOUNTER — Inpatient Hospital Stay (HOSPITAL_COMMUNITY)
Admission: EM | Admit: 2014-09-05 | Discharge: 2014-09-12 | DRG: 469 | Disposition: E | Payer: Medicare Other | Attending: Family Medicine | Admitting: Family Medicine

## 2014-09-05 ENCOUNTER — Encounter (HOSPITAL_COMMUNITY): Admission: EM | Disposition: E | Payer: Self-pay | Source: Home / Self Care | Attending: Family Medicine

## 2014-09-05 ENCOUNTER — Encounter (HOSPITAL_COMMUNITY): Payer: Self-pay

## 2014-09-05 DIAGNOSIS — Z9660 Presence of unspecified orthopedic joint implant: Secondary | ICD-10-CM

## 2014-09-05 DIAGNOSIS — S42101S Fracture of unspecified part of scapula, right shoulder, sequela: Secondary | ICD-10-CM

## 2014-09-05 DIAGNOSIS — F0390 Unspecified dementia without behavioral disturbance: Secondary | ICD-10-CM | POA: Diagnosis present

## 2014-09-05 DIAGNOSIS — M25551 Pain in right hip: Secondary | ICD-10-CM | POA: Diagnosis not present

## 2014-09-05 DIAGNOSIS — J189 Pneumonia, unspecified organism: Secondary | ICD-10-CM | POA: Diagnosis present

## 2014-09-05 DIAGNOSIS — D696 Thrombocytopenia, unspecified: Secondary | ICD-10-CM | POA: Diagnosis present

## 2014-09-05 DIAGNOSIS — J9601 Acute respiratory failure with hypoxia: Secondary | ICD-10-CM | POA: Diagnosis not present

## 2014-09-05 DIAGNOSIS — D72829 Elevated white blood cell count, unspecified: Secondary | ICD-10-CM | POA: Diagnosis present

## 2014-09-05 DIAGNOSIS — H9190 Unspecified hearing loss, unspecified ear: Secondary | ICD-10-CM | POA: Diagnosis present

## 2014-09-05 DIAGNOSIS — S20411A Abrasion of right back wall of thorax, initial encounter: Secondary | ICD-10-CM | POA: Diagnosis present

## 2014-09-05 DIAGNOSIS — Y92199 Unspecified place in other specified residential institution as the place of occurrence of the external cause: Secondary | ICD-10-CM

## 2014-09-05 DIAGNOSIS — M79604 Pain in right leg: Secondary | ICD-10-CM | POA: Diagnosis present

## 2014-09-05 DIAGNOSIS — Z90710 Acquired absence of both cervix and uterus: Secondary | ICD-10-CM

## 2014-09-05 DIAGNOSIS — K921 Melena: Secondary | ICD-10-CM | POA: Diagnosis not present

## 2014-09-05 DIAGNOSIS — Z66 Do not resuscitate: Secondary | ICD-10-CM | POA: Diagnosis present

## 2014-09-05 DIAGNOSIS — S42101A Fracture of unspecified part of scapula, right shoulder, initial encounter for closed fracture: Secondary | ICD-10-CM | POA: Diagnosis not present

## 2014-09-05 DIAGNOSIS — K219 Gastro-esophageal reflux disease without esophagitis: Secondary | ICD-10-CM | POA: Diagnosis present

## 2014-09-05 DIAGNOSIS — S7291XA Unspecified fracture of right femur, initial encounter for closed fracture: Secondary | ICD-10-CM | POA: Diagnosis not present

## 2014-09-05 DIAGNOSIS — N189 Chronic kidney disease, unspecified: Secondary | ICD-10-CM | POA: Diagnosis present

## 2014-09-05 DIAGNOSIS — D62 Acute posthemorrhagic anemia: Secondary | ICD-10-CM | POA: Diagnosis not present

## 2014-09-05 DIAGNOSIS — I471 Supraventricular tachycardia: Secondary | ICD-10-CM | POA: Diagnosis not present

## 2014-09-05 DIAGNOSIS — E43 Unspecified severe protein-calorie malnutrition: Secondary | ICD-10-CM | POA: Diagnosis present

## 2014-09-05 DIAGNOSIS — S42109A Fracture of unspecified part of scapula, unspecified shoulder, initial encounter for closed fracture: Secondary | ICD-10-CM | POA: Diagnosis present

## 2014-09-05 DIAGNOSIS — T148XXA Other injury of unspecified body region, initial encounter: Secondary | ICD-10-CM

## 2014-09-05 DIAGNOSIS — R451 Restlessness and agitation: Secondary | ICD-10-CM | POA: Diagnosis not present

## 2014-09-05 DIAGNOSIS — T148 Other injury of unspecified body region: Secondary | ICD-10-CM | POA: Diagnosis not present

## 2014-09-05 DIAGNOSIS — S72001A Fracture of unspecified part of neck of right femur, initial encounter for closed fracture: Secondary | ICD-10-CM | POA: Diagnosis present

## 2014-09-05 DIAGNOSIS — I129 Hypertensive chronic kidney disease with stage 1 through stage 4 chronic kidney disease, or unspecified chronic kidney disease: Secondary | ICD-10-CM | POA: Diagnosis present

## 2014-09-05 DIAGNOSIS — M25552 Pain in left hip: Secondary | ICD-10-CM

## 2014-09-05 DIAGNOSIS — E875 Hyperkalemia: Secondary | ICD-10-CM | POA: Diagnosis present

## 2014-09-05 DIAGNOSIS — R911 Solitary pulmonary nodule: Secondary | ICD-10-CM | POA: Diagnosis not present

## 2014-09-05 DIAGNOSIS — S72009A Fracture of unspecified part of neck of unspecified femur, initial encounter for closed fracture: Secondary | ICD-10-CM | POA: Diagnosis present

## 2014-09-05 DIAGNOSIS — N179 Acute kidney failure, unspecified: Secondary | ICD-10-CM | POA: Diagnosis present

## 2014-09-05 DIAGNOSIS — Z681 Body mass index (BMI) 19 or less, adult: Secondary | ICD-10-CM | POA: Diagnosis not present

## 2014-09-05 DIAGNOSIS — Z885 Allergy status to narcotic agent status: Secondary | ICD-10-CM

## 2014-09-05 DIAGNOSIS — W19XXXA Unspecified fall, initial encounter: Secondary | ICD-10-CM | POA: Diagnosis not present

## 2014-09-05 DIAGNOSIS — R54 Age-related physical debility: Secondary | ICD-10-CM | POA: Diagnosis present

## 2014-09-05 DIAGNOSIS — S7290XA Unspecified fracture of unspecified femur, initial encounter for closed fracture: Secondary | ICD-10-CM | POA: Insufficient documentation

## 2014-09-05 DIAGNOSIS — W07XXXA Fall from chair, initial encounter: Secondary | ICD-10-CM | POA: Diagnosis present

## 2014-09-05 DIAGNOSIS — S42111A Displaced fracture of body of scapula, right shoulder, initial encounter for closed fracture: Secondary | ICD-10-CM | POA: Diagnosis present

## 2014-09-05 DIAGNOSIS — I2699 Other pulmonary embolism without acute cor pulmonale: Secondary | ICD-10-CM

## 2014-09-05 DIAGNOSIS — D693 Immune thrombocytopenic purpura: Secondary | ICD-10-CM

## 2014-09-05 DIAGNOSIS — R0602 Shortness of breath: Secondary | ICD-10-CM

## 2014-09-05 DIAGNOSIS — Z96649 Presence of unspecified artificial hip joint: Secondary | ICD-10-CM

## 2014-09-05 DIAGNOSIS — Z79899 Other long term (current) drug therapy: Secondary | ICD-10-CM

## 2014-09-05 DIAGNOSIS — R000 Tachycardia, unspecified: Secondary | ICD-10-CM

## 2014-09-05 HISTORY — PX: HIP ARTHROPLASTY: SHX981

## 2014-09-05 HISTORY — DX: Unspecified protein-calorie malnutrition: E46

## 2014-09-05 LAB — PROTIME-INR
INR: 1.04 (ref 0.00–1.49)
PROTHROMBIN TIME: 13.8 s (ref 11.6–15.2)

## 2014-09-05 LAB — CBC
HCT: 36.8 % (ref 36.0–46.0)
Hemoglobin: 12 g/dL (ref 12.0–15.0)
MCH: 28.8 pg (ref 26.0–34.0)
MCHC: 32.6 g/dL (ref 30.0–36.0)
MCV: 88.2 fL (ref 78.0–100.0)
PLATELETS: 68 10*3/uL — AB (ref 150–400)
RBC: 4.17 MIL/uL (ref 3.87–5.11)
RDW: 13.5 % (ref 11.5–15.5)
WBC: 15.3 10*3/uL — ABNORMAL HIGH (ref 4.0–10.5)

## 2014-09-05 LAB — CBC WITH DIFFERENTIAL/PLATELET
Basophils Absolute: 0 10*3/uL (ref 0.0–0.1)
Basophils Relative: 0 % (ref 0–1)
Eosinophils Absolute: 0 10*3/uL (ref 0.0–0.7)
Eosinophils Relative: 0 % (ref 0–5)
HCT: 39.3 % (ref 36.0–46.0)
Hemoglobin: 13.1 g/dL (ref 12.0–15.0)
Lymphocytes Relative: 5 % — ABNORMAL LOW (ref 12–46)
Lymphs Abs: 0.8 10*3/uL (ref 0.7–4.0)
MCH: 29.3 pg (ref 26.0–34.0)
MCHC: 33.3 g/dL (ref 30.0–36.0)
MCV: 87.9 fL (ref 78.0–100.0)
Monocytes Absolute: 2 10*3/uL — ABNORMAL HIGH (ref 0.1–1.0)
Monocytes Relative: 13 % — ABNORMAL HIGH (ref 3–12)
Neutro Abs: 12.6 10*3/uL — ABNORMAL HIGH (ref 1.7–7.7)
Neutrophils Relative %: 82 % — ABNORMAL HIGH (ref 43–77)
Platelets: 71 10*3/uL — ABNORMAL LOW (ref 150–400)
RBC: 4.47 MIL/uL (ref 3.87–5.11)
RDW: 13.1 % (ref 11.5–15.5)
WBC: 15.4 10*3/uL — ABNORMAL HIGH (ref 4.0–10.5)

## 2014-09-05 LAB — COMPREHENSIVE METABOLIC PANEL
ALT: 24 U/L (ref 14–54)
AST: 27 U/L (ref 15–41)
Albumin: 3.2 g/dL — ABNORMAL LOW (ref 3.5–5.0)
Alkaline Phosphatase: 43 U/L (ref 38–126)
Anion gap: 9 (ref 5–15)
BUN: 28 mg/dL — AB (ref 6–20)
CHLORIDE: 104 mmol/L (ref 101–111)
CO2: 28 mmol/L (ref 22–32)
Calcium: 8.7 mg/dL — ABNORMAL LOW (ref 8.9–10.3)
Creatinine, Ser: 1.36 mg/dL — ABNORMAL HIGH (ref 0.44–1.00)
GFR calc Af Amer: 38 mL/min — ABNORMAL LOW (ref >60)
GFR calc non Af Amer: 33 mL/min — ABNORMAL LOW (ref >60)
GLUCOSE: 145 mg/dL — AB (ref 65–99)
POTASSIUM: 5.6 mmol/L — AB (ref 3.5–5.1)
Sodium: 141 mmol/L (ref 135–145)
TOTAL PROTEIN: 5.4 g/dL — AB (ref 6.5–8.1)
Total Bilirubin: 0.6 mg/dL (ref 0.3–1.2)

## 2014-09-05 LAB — SURGICAL PCR SCREEN
MRSA, PCR: NEGATIVE
Staphylococcus aureus: NEGATIVE

## 2014-09-05 LAB — BASIC METABOLIC PANEL
Anion gap: 10 (ref 5–15)
BUN: 28 mg/dL — ABNORMAL HIGH (ref 6–20)
CHLORIDE: 102 mmol/L (ref 101–111)
CO2: 30 mmol/L (ref 22–32)
Calcium: 8.8 mg/dL — ABNORMAL LOW (ref 8.9–10.3)
Creatinine, Ser: 1.1 mg/dL — ABNORMAL HIGH (ref 0.44–1.00)
GFR, EST AFRICAN AMERICAN: 49 mL/min — AB (ref >60)
GFR, EST NON AFRICAN AMERICAN: 43 mL/min — AB (ref >60)
Glucose, Bld: 172 mg/dL — ABNORMAL HIGH (ref 65–99)
Potassium: 4.4 mmol/L (ref 3.5–5.1)
SODIUM: 142 mmol/L (ref 135–145)

## 2014-09-05 SURGERY — HEMIARTHROPLASTY, HIP, DIRECT ANTERIOR APPROACH, FOR FRACTURE
Anesthesia: Monitor Anesthesia Care | Site: Hip | Laterality: Right

## 2014-09-05 MED ORDER — SODIUM CHLORIDE 0.9 % IR SOLN
Status: DC | PRN
  Administered 2014-09-05: 1000 mL

## 2014-09-05 MED ORDER — SENNOSIDES-DOCUSATE SODIUM 8.6-50 MG PO TABS: 1.0000 | ORAL_TABLET | Freq: Every evening | ORAL | Status: DC | PRN

## 2014-09-05 MED ORDER — PHENYLEPHRINE HCL 10 MG/ML IJ SOLN
10.0000 mg | INTRAMUSCULAR | Status: DC | PRN
  Administered 2014-09-05: 20 ug/min via INTRAVENOUS

## 2014-09-05 MED ORDER — MENTHOL 3 MG MT LOZG: 1.0000 | LOZENGE | OROMUCOSAL | Status: DC | PRN

## 2014-09-05 MED ORDER — ONDANSETRON HCL 4 MG PO TABS: 4.0000 mg | ORAL_TABLET | Freq: Four times a day (QID) | ORAL | Status: DC | PRN

## 2014-09-05 MED ORDER — FLEET ENEMA 7-19 GM/118ML RE ENEM: 1.0000 | ENEMA | Freq: Once | RECTAL | Status: AC | PRN

## 2014-09-05 MED ORDER — HYDROCODONE-ACETAMINOPHEN 5-325 MG PO TABS
1.0000 | ORAL_TABLET | Freq: Four times a day (QID) | ORAL | Status: DC | PRN
  Administered 2014-09-06: 2 via ORAL
  Filled 2014-09-05: qty 2

## 2014-09-05 MED ORDER — METOPROLOL TARTRATE 25 MG PO TABS
12.5000 mg | ORAL_TABLET | Freq: Every day | ORAL | Status: DC
  Administered 2014-09-06 – 2014-09-08 (×3): 12.5 mg via ORAL
  Filled 2014-09-05 (×4): qty 1

## 2014-09-05 MED ORDER — PHENOL 1.4 % MT LIQD: 1.0000 | OROMUCOSAL | Status: DC | PRN

## 2014-09-05 MED ORDER — ENSURE ENLIVE PO LIQD
237.0000 mL | Freq: Two times a day (BID) | ORAL | Status: DC
  Administered 2014-09-06 – 2014-09-08 (×2): 237 mL via ORAL

## 2014-09-05 MED ORDER — DOCUSATE SODIUM 100 MG PO CAPS
100.0000 mg | ORAL_CAPSULE | Freq: Two times a day (BID) | ORAL | Status: DC
  Administered 2014-09-05 – 2014-09-08 (×5): 100 mg via ORAL
  Filled 2014-09-05 (×6): qty 1

## 2014-09-05 MED ORDER — ONDANSETRON HCL 4 MG/2ML IJ SOLN
INTRAMUSCULAR | Status: DC | PRN
  Administered 2014-09-05: 4 mg via INTRAVENOUS

## 2014-09-05 MED ORDER — PROMETHAZINE HCL 25 MG/ML IJ SOLN: 6.2500 mg | INTRAMUSCULAR | Status: DC | PRN

## 2014-09-05 MED ORDER — ACETAMINOPHEN 650 MG RE SUPP
650.0000 mg | Freq: Four times a day (QID) | RECTAL | Status: DC | PRN
Start: 2014-09-05 — End: 2014-09-09

## 2014-09-05 MED ORDER — TRAMADOL HCL 50 MG PO TABS
50.0000 mg | ORAL_TABLET | Freq: Four times a day (QID) | ORAL | Status: DC
  Administered 2014-09-06 – 2014-09-07 (×3): 50 mg via ORAL
  Filled 2014-09-05 (×5): qty 1

## 2014-09-05 MED ORDER — LACTATED RINGERS IV SOLN: INTRAVENOUS | Status: DC

## 2014-09-05 MED ORDER — MORPHINE SULFATE 2 MG/ML IJ SOLN: 0.5000 mg | INTRAMUSCULAR | Status: DC | PRN

## 2014-09-05 MED ORDER — ACETAMINOPHEN 325 MG PO TABS
650.0000 mg | ORAL_TABLET | Freq: Four times a day (QID) | ORAL | Status: DC | PRN
  Administered 2014-09-05: 650 mg via ORAL
  Filled 2014-09-05: qty 2

## 2014-09-05 MED ORDER — FENTANYL CITRATE (PF) 250 MCG/5ML IJ SOLN
INTRAMUSCULAR | Status: AC
  Filled 2014-09-05: qty 5

## 2014-09-05 MED ORDER — PROPOFOL 10 MG/ML IV BOLUS
INTRAVENOUS | Status: AC
  Filled 2014-09-05: qty 20

## 2014-09-05 MED ORDER — METOCLOPRAMIDE HCL 5 MG PO TABS: 5.0000 mg | ORAL_TABLET | Freq: Three times a day (TID) | ORAL | Status: DC | PRN

## 2014-09-05 MED ORDER — SODIUM CHLORIDE 0.9 % IV SOLN
INTRAVENOUS | Status: DC
  Administered 2014-09-05 – 2014-09-08 (×3): via INTRAVENOUS

## 2014-09-05 MED ORDER — BUPIVACAINE IN DEXTROSE 0.75-8.25 % IT SOLN
INTRATHECAL | Status: DC | PRN
  Administered 2014-09-05: 12 mg via INTRATHECAL

## 2014-09-05 MED ORDER — PROPOFOL 10 MG/ML IV BOLUS
INTRAVENOUS | Status: DC | PRN
  Administered 2014-09-05: 20 mg via INTRAVENOUS
  Administered 2014-09-05: 10 mg via INTRAVENOUS

## 2014-09-05 MED ORDER — ONDANSETRON HCL 4 MG/2ML IJ SOLN
4.0000 mg | Freq: Once | INTRAMUSCULAR | Status: AC
  Administered 2014-09-05: 4 mg via INTRAVENOUS
  Filled 2014-09-05: qty 2

## 2014-09-05 MED ORDER — FENTANYL CITRATE (PF) 100 MCG/2ML IJ SOLN: 25.0000 ug | INTRAMUSCULAR | Status: DC | PRN

## 2014-09-05 MED ORDER — ONDANSETRON HCL 4 MG/2ML IJ SOLN: 4.0000 mg | Freq: Four times a day (QID) | INTRAMUSCULAR | Status: DC | PRN

## 2014-09-05 MED ORDER — PHENYLEPHRINE HCL 10 MG/ML IJ SOLN
INTRAMUSCULAR | Status: DC | PRN
  Administered 2014-09-05: 80 ug via INTRAVENOUS
  Administered 2014-09-05: 40 ug via INTRAVENOUS
  Administered 2014-09-05: 80 ug via INTRAVENOUS
  Administered 2014-09-05: 40 ug via INTRAVENOUS
  Administered 2014-09-05 (×2): 80 ug via INTRAVENOUS

## 2014-09-05 MED ORDER — SODIUM CHLORIDE 0.9 % IV SOLN
INTRAVENOUS | Status: DC
  Administered 2014-09-05 (×2): via INTRAVENOUS

## 2014-09-05 MED ORDER — BISACODYL 5 MG PO TBEC: 5.0000 mg | DELAYED_RELEASE_TABLET | Freq: Every day | ORAL | Status: DC | PRN

## 2014-09-05 MED ORDER — ENOXAPARIN SODIUM 30 MG/0.3ML ~~LOC~~ SOLN: 30.0000 mg | SUBCUTANEOUS | Status: DC

## 2014-09-05 MED ORDER — CEFAZOLIN SODIUM-DEXTROSE 2-3 GM-% IV SOLR
2.0000 g | INTRAVENOUS | Status: AC
  Administered 2014-09-05: 2 g via INTRAVENOUS
  Filled 2014-09-05: qty 50

## 2014-09-05 MED ORDER — BACITRACIN ZINC 500 UNIT/GM EX OINT
TOPICAL_OINTMENT | CUTANEOUS | Status: AC
  Filled 2014-09-05: qty 0.9

## 2014-09-05 MED ORDER — LIDOCAINE HCL (CARDIAC) 20 MG/ML IV SOLN
INTRAVENOUS | Status: AC
  Filled 2014-09-05: qty 5

## 2014-09-05 MED ORDER — FENTANYL CITRATE (PF) 100 MCG/2ML IJ SOLN
50.0000 ug | Freq: Once | INTRAMUSCULAR | Status: AC
  Administered 2014-09-05: 50 ug via INTRAVENOUS
  Filled 2014-09-05: qty 2

## 2014-09-05 MED ORDER — METOCLOPRAMIDE HCL 5 MG/ML IJ SOLN: 5.0000 mg | Freq: Three times a day (TID) | INTRAMUSCULAR | Status: DC | PRN

## 2014-09-05 MED ORDER — CEFAZOLIN SODIUM-DEXTROSE 2-3 GM-% IV SOLR
2.0000 g | Freq: Four times a day (QID) | INTRAVENOUS | Status: AC
  Administered 2014-09-05 – 2014-09-06 (×2): 2 g via INTRAVENOUS
  Filled 2014-09-05 (×2): qty 50

## 2014-09-05 SURGICAL SUPPLY — 55 items
BLADE SAW SAG 73X25 THK (BLADE) ×2
BLADE SAW SGTL 73X25 THK (BLADE) ×1 IMPLANT
BRUSH FEMORAL CANAL (MISCELLANEOUS) IMPLANT
CAPT HIP HEMI 1 ×3 IMPLANT
COVER SURGICAL LIGHT HANDLE (MISCELLANEOUS) ×3 IMPLANT
DRAPE IMP U-DRAPE 54X76 (DRAPES) ×3 IMPLANT
DRAPE INCISE IOBAN 66X45 STRL (DRAPES) ×3 IMPLANT
DRAPE ORTHO SPLIT 77X108 STRL (DRAPES) ×4
DRAPE SURG ORHT 6 SPLT 77X108 (DRAPES) ×2 IMPLANT
DRAPE U-SHAPE 47X51 STRL (DRAPES) ×3 IMPLANT
DRSG ADAPTIC 3X8 NADH LF (GAUZE/BANDAGES/DRESSINGS) ×3 IMPLANT
DRSG MEPILEX BORDER 4X12 (GAUZE/BANDAGES/DRESSINGS) ×3 IMPLANT
DRSG MEPILEX BORDER 4X8 (GAUZE/BANDAGES/DRESSINGS) ×3 IMPLANT
DRSG PAD ABDOMINAL 8X10 ST (GAUZE/BANDAGES/DRESSINGS) ×3 IMPLANT
DURAPREP 26ML APPLICATOR (WOUND CARE) ×3 IMPLANT
ELECT CAUTERY BLADE 6.4 (BLADE) ×3 IMPLANT
ELECT REM PT RETURN 9FT ADLT (ELECTROSURGICAL) ×3
ELECTRODE REM PT RTRN 9FT ADLT (ELECTROSURGICAL) ×1 IMPLANT
EVACUATOR 1/8 PVC DRAIN (DRAIN) IMPLANT
FACESHIELD WRAPAROUND (MASK) ×6 IMPLANT
GAUZE SPONGE 4X4 12PLY STRL (GAUZE/BANDAGES/DRESSINGS) ×3 IMPLANT
GLOVE BIOGEL PI IND STRL 8 (GLOVE) ×2 IMPLANT
GLOVE BIOGEL PI INDICATOR 8 (GLOVE) ×4
GLOVE ORTHO TXT STRL SZ7.5 (GLOVE) ×6 IMPLANT
GLOVE SURG ORTHO 8.0 STRL STRW (GLOVE) ×6 IMPLANT
GOWN STRL REUS W/ TWL LRG LVL3 (GOWN DISPOSABLE) ×1 IMPLANT
GOWN STRL REUS W/ TWL XL LVL3 (GOWN DISPOSABLE) ×1 IMPLANT
GOWN STRL REUS W/TWL LRG LVL3 (GOWN DISPOSABLE) ×2
GOWN STRL REUS W/TWL XL LVL3 (GOWN DISPOSABLE) ×2
HANDPIECE INTERPULSE COAX TIP (DISPOSABLE)
IMMOBILIZER KNEE 22 UNIV (SOFTGOODS) ×3 IMPLANT
KIT BASIN OR (CUSTOM PROCEDURE TRAY) ×3 IMPLANT
KIT ROOM TURNOVER OR (KITS) ×3 IMPLANT
MANIFOLD NEPTUNE II (INSTRUMENTS) ×3 IMPLANT
NEEDLE 22X1 1/2 (OR ONLY) (NEEDLE) ×3 IMPLANT
NEEDLE MAYO TROCAR (NEEDLE) ×3 IMPLANT
NS IRRIG 1000ML POUR BTL (IV SOLUTION) ×3 IMPLANT
PACK TOTAL JOINT (CUSTOM PROCEDURE TRAY) ×3 IMPLANT
PACK UNIVERSAL I (CUSTOM PROCEDURE TRAY) ×3 IMPLANT
PAD ARMBOARD 7.5X6 YLW CONV (MISCELLANEOUS) ×6 IMPLANT
PILLOW ABDUCTION HIP (SOFTGOODS) IMPLANT
SET HNDPC FAN SPRY TIP SCT (DISPOSABLE) IMPLANT
SPONGE LAP 18X18 X RAY DECT (DISPOSABLE) ×3 IMPLANT
STAPLER VISISTAT 35W (STAPLE) ×3 IMPLANT
SUCTION FRAZIER TIP 10 FR DISP (SUCTIONS) ×3 IMPLANT
SUT ETHIBOND NAB CT1 #1 30IN (SUTURE) ×12 IMPLANT
SUT VIC AB 1 CTB1 27 (SUTURE) ×6 IMPLANT
SUT VIC AB 2-0 CT1 27 (SUTURE) ×4
SUT VIC AB 2-0 CT1 TAPERPNT 27 (SUTURE) ×2 IMPLANT
SYR CONTROL 10ML LL (SYRINGE) ×3 IMPLANT
TOWEL OR 17X24 6PK STRL BLUE (TOWEL DISPOSABLE) ×3 IMPLANT
TOWEL OR 17X26 10 PK STRL BLUE (TOWEL DISPOSABLE) ×3 IMPLANT
TOWER CARTRIDGE SMART MIX (DISPOSABLE) IMPLANT
TRAY FOLEY CATH 16FRSI W/METER (SET/KITS/TRAYS/PACK) ×3 IMPLANT
WATER STERILE IRR 1000ML POUR (IV SOLUTION) ×9 IMPLANT

## 2014-09-05 NOTE — ED Notes (Signed)
Attempted to give report to 5N nurse. Call back number given.

## 2014-09-05 NOTE — Care Management (Signed)
Utilization review completed by Ammiel Guiney N. Kelie Gainey, RN BSN 

## 2014-09-05 NOTE — Anesthesia Postprocedure Evaluation (Signed)
Anesthesia Post Note  Patient: Amy Ward  Procedure(s) Performed: Procedure(s) (LRB): ARTHROPLASTY BIPOLAR HIP (HEMIARTHROPLASTY) (Right)  Anesthesia type: MAC/SAB  Patient location: PACU  Post pain: Pain level controlled  Post assessment: Patient's Cardiovascular Status Stable  Last Vitals:  Filed Vitals:   08/15/2014 1915  BP: 100/41  Pulse: 96  Temp: 36.9 C  Resp: 17    Post vital signs: Reviewed and stable  Level of consciousness: sedated  Complications: No apparent anesthesia complications

## 2014-09-05 NOTE — Transfer of Care (Signed)
Immediate Anesthesia Transfer of Care Note  Patient: Amy Ward  Procedure(s) Performed: Procedure(s): ARTHROPLASTY BIPOLAR HIP (HEMIARTHROPLASTY) (Right)  Patient Location: PACU  Anesthesia Type:Spinal  Level of Consciousness: awake  Airway & Oxygen Therapy: Patient Spontanous Breathing  Post-op Assessment: Report given to RN and Post -op Vital signs reviewed and stable  Post vital signs: stable  Last Vitals:  Filed Vitals:   09/01/2014 1113  BP: 111/34  Pulse: 81  Temp: 36.8 C  Resp: 26    Complications: No apparent anesthesia complications

## 2014-09-05 NOTE — Progress Notes (Signed)
Ortho consulted.  Plan right hip hemi today this afternoon if optimized.   Full consult to follow.  Eulas Post, MD

## 2014-09-05 NOTE — ED Notes (Signed)
Bed: TZ00 Expected date:  Expected time:  Means of arrival:  Comments: EMS 81yo Fall, rt shoulder pain

## 2014-09-05 NOTE — ED Notes (Signed)
Patient transported to X-ray 

## 2014-09-05 NOTE — Care Management Note (Signed)
Case Management Note  Patient Details  Name: Amy Ward MRN: [id number] Date of Birth: Mar 24, 1921  Subjective/Objective:               S/p  ARTHROPLASTY BIPOLAR HIP (HEMIARTHROPLASTY) (Right)    Action/Plan: Awaits PT/OT eval  Expected Discharge Date:                  Expected Discharge Plan:     In-House Referral:     Discharge planning Services  CM Consult  Post Acute Care Choice:    Choice offered to:     DME Arranged:    DME Agency:     HH Arranged:    HH Agency:     Status of Service:  In process, will continue to follow  Medicare Important Message Given:    Date Medicare IM Given:    Medicare IM give by:    Date Additional Medicare IM Given:    Additional Medicare Important Message give by:     If discussed at Long Length of Stay Meetings, dates discussed:    Additional Comments:  Governor Specking, RN 10-03-14, 6:52 PM

## 2014-09-05 NOTE — Brief Op Note (Signed)
08/24/2014  2:49 PM  PATIENT:  Amy Ward  81 y.o. female  PRE-OPERATIVE DIAGNOSIS:  Fractured Right Hip  POST-OPERATIVE DIAGNOSIS:  Fractured Right Hip  PROCEDURE:  Procedure(s): ARTHROPLASTY BIPOLAR HIP (HEMIARTHROPLASTY) (Right)  SURGEON:  Surgeon(s) and Role:    * Frederico Hamman, MD - Primary  PHYSICIAN ASSISTANT: Margart Sickles, PA-C  ASSISTANTS:   ANESTHESIA:   spinal  EBL:  Total I/O In: 1500 [I.V.:1500] Out: 200 [Blood:200]  BLOOD ADMINISTERED:none  DRAINS: none   LOCAL MEDICATIONS USED:  NONE  SPECIMEN:  No Specimen  DISPOSITION OF SPECIMEN:  N/A  COUNTS:  YES  TOURNIQUET:  * No tourniquets in log *  DICTATION: .Other Dictation: Dictation Number unknown  PLAN OF CARE: Admit to inpatient   PATIENT DISPOSITION:  PACU - hemodynamically stable.   Delay start of Pharmacological VTE agent (>24hrs) due to surgical blood loss or risk of bleeding: yes

## 2014-09-05 NOTE — ED Notes (Addendum)
Pt transported by [facility] for fall.  Pt was sitting on walker chair and it went out from under her.  Pt fell onto right side and is c/o right shoulder and right leg pain.  No LOC. Denies hitting head, denies neck and back pain. Pt from [facility].

## 2014-09-05 NOTE — Consult Note (Signed)
Reason for Consult:fall, right hip fracture  Referring Physician: Dr. Angelena Form is an 81 y.o. female.  HPI: Amy Ward is a 81 y.o. female with history of tachycardia on metoprolol was brought to the ER after patient had a fall at her nursing home. As per the patient patient was walking when patient suddenly fell. Denies any loss of consciousness or hitting her head. Patient fell backwards and scraped the skin on the upper back. In the ER x-rays reveal right hip fracture and right scapular fracture and on-call orthopedic surgeon Dr. Mardelle Matte and team was consulted and patient was admitted for further management and hip fracture, deemed stable for surgery. Patient denies any chest pain shortness of breath nausea vomiting headache or visual symptoms.  Review of Systems: As presented in the history of presenting illness, rest negative.  Past Medical History  Diagnosis Date  . Hypertension   . CKD (chronic kidney disease)   . Malnutrition     Past Surgical History  Procedure Laterality Date  . Abdominal hysterectomy    . Thyroidectomy      Family History  Problem Relation Age of Onset  . CAD Neg Hx     Social History:  reports that she has never smoked. She does not have any smokeless tobacco history on file. She reports that she does not drink alcohol or use illicit drugs.  Allergies:  Allergies  Allergen Reactions  . Codeine Other (See Comments)    unknown    Medications: I have reviewed the patient's current medications.  Results for orders placed or performed during the hospital encounter of 08/21/2014 (from the past 48 hour(s))  Basic metabolic panel     Status: Abnormal   Collection Time: 08/26/2014  3:46 AM  Result Value Ref Range   Sodium 142 135 - 145 mmol/L   Potassium 4.4 3.5 - 5.1 mmol/L   Chloride 102 101 - 111 mmol/L   CO2 30 22 - 32 mmol/L   Glucose, Bld 172 (H) 65 - 99 mg/dL   BUN 28 (H) 6 - 20 mg/dL   Creatinine, Ser 1.10 (H) 0.44 - 1.00 mg/dL    Calcium 8.8 (L) 8.9 - 10.3 mg/dL   GFR calc non Af Amer 43 (L) >60 mL/min   GFR calc Af Amer 49 (L) >60 mL/min    Comment: (NOTE) The eGFR has been calculated using the CKD EPI equation. This calculation has not been validated in all clinical situations. eGFR's persistently <60 mL/min signify possible Chronic Kidney Disease.    Anion gap 10 5 - 15  CBC WITH DIFFERENTIAL     Status: Abnormal   Collection Time: 08/21/2014  3:46 AM  Result Value Ref Range   WBC 15.4 (H) 4.0 - 10.5 K/uL   RBC 4.47 3.87 - 5.11 MIL/uL   Hemoglobin 13.1 12.0 - 15.0 g/dL   HCT 39.3 36.0 - 46.0 %   MCV 87.9 78.0 - 100.0 fL   MCH 29.3 26.0 - 34.0 pg   MCHC 33.3 30.0 - 36.0 g/dL   RDW 13.1 11.5 - 15.5 %   Platelets 71 (L) 150 - 400 K/uL    Comment: REPEATED TO VERIFY SPECIMEN CHECKED FOR CLOTS PLATELET COUNT CONFIRMED BY SMEAR    Neutrophils Relative % 82 (H) 43 - 77 %   Neutro Abs 12.6 (H) 1.7 - 7.7 K/uL   Lymphocytes Relative 5 (L) 12 - 46 %   Lymphs Abs 0.8 0.7 - 4.0 K/uL   Monocytes  Relative 13 (H) 3 - 12 %   Monocytes Absolute 2.0 (H) 0.1 - 1.0 K/uL   Eosinophils Relative 0 0 - 5 %   Eosinophils Absolute 0.0 0.0 - 0.7 K/uL   Basophils Relative 0 0 - 1 %   Basophils Absolute 0.0 0.0 - 0.1 K/uL  CBC     Status: Abnormal   Collection Time: 08/25/2014 11:00 AM  Result Value Ref Range   WBC 15.3 (H) 4.0 - 10.5 K/uL   RBC 4.17 3.87 - 5.11 MIL/uL   Hemoglobin 12.0 12.0 - 15.0 g/dL   HCT 36.8 36.0 - 46.0 %   MCV 88.2 78.0 - 100.0 fL   MCH 28.8 26.0 - 34.0 pg   MCHC 32.6 30.0 - 36.0 g/dL   RDW 13.5 11.5 - 15.5 %   Platelets 68 (L) 150 - 400 K/uL    Comment: PLATELET COUNT CONFIRMED BY SMEAR  Protime-INR     Status: None   Collection Time: 08/28/2014 11:00 AM  Result Value Ref Range   Prothrombin Time 13.8 11.6 - 15.2 seconds   INR 1.04 0.00 - 1.49    Dg Chest 2 View  08/27/2014   CLINICAL DATA:  Fall from a walker, RIGHT hip in shoulder pain.  EXAM: CHEST  2 VIEW  COMPARISON:  Chest radiograph  September 04, 2013  FINDINGS: Cardiac silhouette is normal in size, tortuous calcified aorta. No pleural effusion or focal consolidation. Stable probable granulomata. No pneumothorax. Patient is osteopenic. Old distal LEFT clavicle fracture. Soft tissue planes are nonsuspicious.  IMPRESSION: No active cardiopulmonary disease.   Electronically Signed   By: Elon Alas M.D.   On: 08/31/2014 04:48   Dg Shoulder Right  09/10/2014   CLINICAL DATA:  [place] from walker, RIGHT shoulder pain, no head injury or loss of consciousness.  EXAM: RIGHT SHOULDER - 2+ VIEW  COMPARISON:  None.  FINDINGS: Acute mildly displaced fracture of the lower scapula best seen on the Y-view. Humeral head is located. Patient is osteopenic without destructive bony lesions. Periarticular soft tissue planes are nonsuspicious. Mild vascular calcifications.  IMPRESSION: Acute mildly displaced lower scapular fracture.  No dislocation.   Electronically Signed   By: Elon Alas M.D.   On: 09/06/2014 04:40   Dg Hip Unilat With Pelvis 2-3 Views Right  09/11/2014   CLINICAL DATA:  Fall from walker, RIGHT leg pain.  EXAM: RIGHT HIP (WITH PELVIS) 2-3 VIEWS  COMPARISON:  None.  FINDINGS: Acute oblique fracture of the RIGHT femoral neck with mild varus angulation distal bony fragment, femoral heads are located. No dislocation. Old appearing nondisplaced LEFT superior and inferior pubic ramus fracture. Patient is osteopenic without destructive bony lesions. Soft tissue planes are nonsuspicious.  IMPRESSION: Acute mildly angulated RIGHT femoral neck fracture without dislocation.  Chronic appearing nondisplaced LEFT superior and inferior pubic remain fractures, recommend correlation with point tenderness.  Osteopenia, decreasing sensitivity for acute nondisplaced fractures.   Electronically Signed   By: Elon Alas M.D.   On: 09/08/2014 04:45    Review of Systems  Constitutional: Negative for fever.  Eyes: Negative for blurred vision and  double vision.  Respiratory: Negative for shortness of breath.   Cardiovascular: Negative for chest pain.  Gastrointestinal: Negative for nausea and vomiting.  Musculoskeletal: Positive for joint pain and falls.  Neurological: Negative for dizziness, loss of consciousness and headaches.   Blood pressure 111/34, pulse 81, temperature 98.2 F (36.8 C), temperature source Axillary, resp. rate 26, weight 45.36 kg (100 lb), SpO2  95 %. Physical Exam  Constitutional: She is oriented to person, place, and time.  Poorly nourished, moderate build  HENT:  Head: Atraumatic.  Nose: Nose normal.  Eyes: Right eye exhibits no discharge. Left eye exhibits no discharge.  Neck: Normal range of motion. Neck supple.  Cardiovascular: Intact distal pulses.   Respiratory: No respiratory distress.  GI: Soft. She exhibits no distension. There is no tenderness.  Musculoskeletal:       Right hip: She exhibits decreased range of motion, tenderness and deformity. She exhibits no laceration.  Neurological: She is alert and oriented to person, place, and time.  Skin: Skin is warm and dry. No erythema.  Psychiatric: She has a normal mood and affect. Her behavior is normal.    Assessment/Plan: Right hip fracrure Right Scapula fracture  Nonoperative management right scapula fracture may wbat through the RUE as tolerated with walker  Right hip fracture in need of surgical fixation, medicine optimized for surgery.  Discussed risks and benefits of right hip hemiarthroplasty and patient wishes to proceed.  NWB until surgery, NPO, pain med as ordered.    Nels Munn JR,W D 09/06/2014, 12:01 PM

## 2014-09-05 NOTE — H&P (Signed)
[facility] History and Physical  Amy Ward HTD:[id number] DOB: 28-Jun-1921 DOA: 2014/10/04  Referring physician: Dr.Hoerton. PCP: Darnelle Bos, MD  Specialists: None.  Chief Complaint: Fall.  HPI: Amy Ward is a 81 y.o. female with history of tachycardia on metoprolol was brought to the ER after patient had a fall at her nursing home. As per the patient patient was walking when patient suddenly fell. Denies any loss of consciousness or hitting her head. Patient fell backwards and scraped the skin on the upper back. In the ER x-rays reveal right hip fracture and right scapular fracture and on-call orthopedic surgeon Dr. Dion Saucier was consulted and patient will be admitted for further management and hip fracture. Patient denies any chest pain shortness of breath nausea vomiting headache or visual symptoms.  Review of Systems: As presented in the history of presenting illness, rest negative.  Past Medical History  Diagnosis Date  . Hypertension   . CKD (chronic kidney disease)   . Malnutrition    Past Surgical History  Procedure Laterality Date  . Abdominal hysterectomy    . Thyroidectomy     Social History:  reports that she has never smoked. She does not have any smokeless tobacco history on file. She reports that she does not drink alcohol or use illicit drugs. Where does patient live nursing home.  Can patient participate in ADLs? Not sure.  Allergies  Allergen Reactions  . Codeine Other (See Comments)    unknown    Family History:  Family History  Problem Relation Age of Onset  . CAD Neg Hx       Prior to Admission medications   Medication Sig Start Date End Date Taking? Authorizing Provider  Cholecalciferol (VITAMIN D-3) 1000 UNITS CAPS Take 1,000 Units by mouth daily.   Yes Historical Provider, MD  metoprolol tartrate (LOPRESSOR) 25 MG tablet Take 15 mg by mouth daily. 09/01/14  Yes Historical Provider, MD  amoxicillin (AMOXIL) 500 MG capsule Take  1 capsule (500 mg total) by mouth 3 (three) times daily. Patient not taking: Reported on 2014/10/04 04/12/14   Amy Kirichenko, PA-C  traMADol (ULTRAM) 50 MG tablet Take 50 mg by mouth every 6 (six) hours as needed for moderate pain.    Historical Provider, MD    Physical Exam: Filed Vitals:   October 04, 2014 0247 10/04/2014 0302 Oct 04, 2014 0523 10-04-2014 0645  BP:   120/50 89/40  Pulse:   105 72  Temp:      TempSrc:      Resp:   28 32  SpO2: 100% 97% 97% 100%     General:  Moderately built and poorly nourished.  Eyes: Anicteric no pallor.  ENT: No discharge from the ears eyes nose and mouth.  Neck: No mass felt. No neck rigidity.  Cardiovascular: S1-S2 heard.  Respiratory: No rhonchi or crepitations.  Abdomen: Soft nontender bowel sounds present.  Skin: Skin abrasion on the right upper back.  Musculoskeletal: Pain on moving the right hip.  Psychiatric: Appears normal.  Neurologic: Alert awake oriented to time place and person. Moves all extremities.  Labs on Admission:  Basic Metabolic Panel:  Recent Labs Lab 10/04/2014 0346  NA 142  K 4.4  CL 102  CO2 30  GLUCOSE 172*  BUN 28*  CREATININE 1.10*  CALCIUM 8.8*   Liver Function Tests: No results for input(s): AST, ALT, ALKPHOS, BILITOT, PROT, ALBUMIN in the last 168 hours. No results for input(s): LIPASE, AMYLASE in the last 168 hours. No results for input(s):  AMMONIA in the last 168 hours. CBC:  Recent Labs Lab 09-19-2014 0346  WBC 15.4*  NEUTROABS 12.6*  HGB 13.1  HCT 39.3  MCV 87.9  PLT 71*   Cardiac Enzymes: No results for input(s): CKTOTAL, CKMB, CKMBINDEX, TROPONINI in the last 168 hours.  BNP (last 3 results) No results for input(s): BNP in the last 8760 hours.  ProBNP (last 3 results) No results for input(s): PROBNP in the last 8760 hours.  CBG: No results for input(s): GLUCAP in the last 168 hours.  Radiological Exams on Admission: Dg Chest 2 View  2014/09/19   CLINICAL DATA:  Fall from a  walker, RIGHT hip in shoulder pain.  EXAM: CHEST  2 VIEW  COMPARISON:  Chest radiograph September 04, 2013  FINDINGS: Cardiac silhouette is normal in size, tortuous calcified aorta. No pleural effusion or focal consolidation. Stable probable granulomata. No pneumothorax. Patient is osteopenic. Old distal LEFT clavicle fracture. Soft tissue planes are nonsuspicious.  IMPRESSION: No active cardiopulmonary disease.   Electronically Signed   By: Awilda Metro M.D.   On: 19-Sep-2014 04:48   Dg Shoulder Right  Sep 19, 2014   CLINICAL DATA:  [place] Se[place] from walker, RIGHT shoulder pain, no head injury or loss of consciousness.  EXAM: RIGHT SHOULDER - 2+ VIEW  COMPARISON:  None.  FINDINGS: Acute mildly displaced fracture of the lower scapula best seen on the Y-view. Humeral head is located. Patient is osteopenic without destructive bony lesions. Periarticular soft tissue planes are nonsuspicious. Mild vascular calcifications.  IMPRESSION: Acute mildly displaced lower scapular fracture.  No dislocation.   Electronically Signed   By: Awilda Metro M.D.   On: Sep 19, 2014 04:40   Dg Hip Unilat With Pelvis 2-3 Views Right  09/19/2014   CLINICAL DATA:  Fall from walker, RIGHT leg pain.  EXAM: RIGHT HIP (WITH PELVIS) 2-3 VIEWS  COMPARISON:  None.  FINDINGS: Acute oblique fracture of the RIGHT femoral neck with mild varus angulation distal bony fragment, femoral heads are located. No dislocation. Old appearing nondisplaced LEFT superior and inferior pubic ramus fracture. Patient is osteopenic without destructive bony lesions. Soft tissue planes are nonsuspicious.  IMPRESSION: Acute mildly angulated RIGHT femoral neck fracture without dislocation.  Chronic appearing nondisplaced LEFT superior and inferior pubic remain fractures, recommend correlation with point tenderness.  Osteopenia, decreasing sensitivity for acute nondisplaced fractures.   Electronically Signed   By: Awilda Metro M.D.   On: 09-19-14 04:45    EKG:  Independently reviewed. Normal sinus rhythm with short PR interval.  Assessment/Plan Principal Problem:   Hip fracture Active Problems:   Scapular fracture   Tachycardia   1. Right hip fracture and right scapular fracture status post mechanical fall - patient is at moderate risk for intermediate risk procedure for hip ORIF. Dr. Dion Saucier on call orthopedic surgeon will be seeing patient in consult. Continue with patient regarding medication and patient will be kept nothing by mouth. 2. Right upper back skin abrasion status post fall - wound team consult. 3. History of tachycardia and arrhythmia - continue metoprolol. 4. Leukocytosis probably reactionary - patient is afebrile. Chest x-ray is unremarkable. UA is pending. Follow CBC.  Patient will be transferred to [facility] for further management. Patient's son is agreeable to transfer. Dr. Allena Katz is the accepting physician.   DVT Prophylaxis SCDs.  Code Status: DO NOT RESUSCITATE.  Family Communication: Discussed with patient's son.  Disposition Plan: Admit to inpatient.    KAKRAKANDY,ARSHAD N. [facility] Pager [phone].  If 7PM-7AM, please contact  night-coverage [email or web address] Password [email or web address] 16-Sep-2014, 7:20 AM

## 2014-09-05 NOTE — Anesthesia Preprocedure Evaluation (Addendum)
Anesthesia Evaluation  Patient identified by MRN, date of birth, ID band Patient awake    Reviewed: Allergy & Precautions, NPO status , Patient's Chart, lab work & pertinent test results  History of Anesthesia Complications Negative for: history of anesthetic complications  Airway Mallampati: II  TM Distance: >3 FB Neck ROM: Full    Dental  (+) Dental Advisory Given, Poor Dentition   Pulmonary neg pulmonary ROS,    Pulmonary exam normal       Cardiovascular hypertension, Pt. on medications and Pt. on home beta blockers Normal cardiovascular exam    Neuro/Psych negative neurological ROS  negative psych ROS   GI/Hepatic Neg liver ROS, GERD-  ,  Endo/Other    Renal/GU negative Renal ROS     Musculoskeletal   Abdominal   Peds  Hematology   Anesthesia Other Findings   Reproductive/Obstetrics                            Anesthesia Physical Anesthesia Plan  ASA: III  Anesthesia Plan: MAC and Spinal   Post-op Pain Management:    Induction:   Airway Management Planned: Simple Face Mask  Additional Equipment:   Intra-op Plan:   Post-operative Plan:   Informed Consent: I have reviewed the patients History and Physical, chart, labs and discussed the procedure including the risks, benefits and alternatives for the proposed anesthesia with the patient or authorized representative who has indicated his/her understanding and acceptance.   Dental advisory given  Plan Discussed with: CRNA, Anesthesiologist and Surgeon  Anesthesia Plan Comments:        Anesthesia Quick Evaluation

## 2014-09-05 NOTE — ED Notes (Signed)
EKG to be completed upon pt return

## 2014-09-05 NOTE — Progress Notes (Signed)
5:27 PM I agree with HPI/GPe and A/P per Dr. Toniann Fail    81 y/o nursing home resident with a bland medical history other than tachycardia admitted to the hospital early on 09/01/14 with fall and hip fracture at ALF where she stays  She is very [facility] but family tells me she lives pretty independently att facility and is relatively high functioning She seems to be doing well post-op I have d/c her IV morphine/REglans and placed her on scheduled Tramadol as she will need pain control-this is NOT to be given if patient is asleep   AKI + hyperkalemia-Gentle hydration-expect will resolve with IV fluid-labs am HIp # s/p repair 6/24- 1. per discretion Ortho services-appreciate their input into   Anticoagulation post-op  Weight bearing tolerance for therapy services  Wound care  Pain management  Follow-up services required TCP-unclear etiology.  monitor counts. SCD's for now   Patient Active Problem List   Diagnosis Date Noted  . Hip fracture Sep 14, 2014  . Scapular fracture 2014/09/14  . Tachycardia 09/14/2014  . Abrasion   . Femur fracture   . Scapula fracture   . GERD (gastroesophageal reflux disease) 11/01/2012  . Rash 11/01/2012  . Generalized weakness 11/01/2012  . Hypokalemia 10/17/2012  . Thrombocytopenia, unspecified 10/16/2012  . Small bowel obstruction 10/13/2012  . Hypertension 10/13/2012  . UTI (lower urinary tract infection) 10/13/2012  . Pulmonary nodule 10/13/2012  . Weight loss, unintentional 07/26/2012  . Sinus tachycardia 07/26/2012   Pleas Koch, MD [facility] [phone]

## 2014-09-05 NOTE — Discharge Instructions (Signed)
Diet: As you were doing prior to hospitalization   Activity: Increase activity slowly as tolerated  No lifting or driving for 6 weeks   Shower: May shower without a dressing on post op day #5, NO SOAKING in tub   Dressing: You may change your dressing on post op day #3.  Then change the dressing daily with sterile 4"x4"s gauze dressing    Weight Bearing: weight bearing as tolerated. Use a walker or  Crutches as instructed. Posterior Hip Dislocation Precautions  To prevent constipation: you may use a stool softener such as -  Colace ( over the counter) 100 mg by mouth twice a day  Drink plenty of fluids ( prune juice may be helpful) and high fiber foods  Miralax ( over the counter) for constipation as needed.   Precautions: If you experience chest pain or shortness of breath - call 911 immediately For transfer to the hospital emergency department!!  If you develop a fever greater that 101 F, purulent drainage from wound, increased redness or drainage from wound, or calf pain -- Call the office   Follow- Up Appointment: Please call for an appointment to be seen in 2 weeks  [place] - [phone]

## 2014-09-05 NOTE — ED Notes (Signed)
EKG given to EDP, Horton,MD., for review. 

## 2014-09-05 NOTE — Progress Notes (Signed)
Initial Nutrition Assessment  DOCUMENTATION CODES:  Severe malnutrition in context of chronic illness, Underweight  INTERVENTION: Once diet advances provide, Ensure Enlive (each supplement provides 350kcal and 20 grams of protein) (BID)  NUTRITION DIAGNOSIS:  Malnutrition related to chronic illness as evidenced by severe depletion of body fat, severe depletion of muscle mass.  GOAL:  Patient will meet greater than or equal to 90% of their needs  MONITOR:  Diet advancement, Weight trends, Labs, I & O's  REASON FOR ASSESSMENT:  Consult Hip fracture protocol  ASSESSMENT: Pt with history of tachycardia, HTN, CKD was brought to the ER after patient had a fall at her nursing home. X-rays reveal right hip fracture and right scapular fracture.  PROCEDURE (6/24): ARTHROPLASTY BIPOLAR HIP (HEMIARTHROPLASTY) (Right)  Pt is currently NPO for surgery today. Pt was confused during time of visit. Family at bedside. They reports pt has a fair appetite and consumes 2-3 meals daily with an Ensure 1-2 times a day. Pt usual body weight is reported to be ~85 lbs which has been stable over the past 2 years. RD to order Ensure to aid in caloric and protein needs.   Nutrition-Focused physical exam completed. Findings are severe fat depletion, severe muscle depletion, and no edema.   Labs: Low calcium and GFR. High potassium (5.6) and BUN.  Height:  Ht Readings from Last 1 Encounters:  08-Sep-2014 5\' 2"  (1.575 m)    Weight:  Wt Readings from Last 1 Encounters:  09-08-14 100 lb (45.36 kg)    Ideal Body Weight:  50 kg  Wt Readings from Last 10 Encounters:  08-Sep-2014 100 lb (45.36 kg)  11/01/12 92 lb 9.6 oz (42.003 kg)  10/23/12 98 lb 8 oz (44.679 kg)  07/26/12 93 lb 12.8 oz (42.547 kg)    BMI:  Body mass index is 18.29 kg/(m^2).  Estimated Nutritional Needs:  Kcal:  1400-1600  Protein:  60-75 grams  Fluid:  >/= 1.5 L/day  Skin:   (Incision on R hip)  Diet Order:  Diet NPO  time specified  EDUCATION NEEDS:  No education needs identified at this time   Intake/Output Summary (Last 24 hours) at 2014/09/08 1602 Last data filed at Sep 08, 2014 1437  Gross per 24 hour  Intake   1500 ml  Output    200 ml  Net   1300 ml    Last BM:  PTA  Roslyn Smiling, MS, RD, LDN Pager # [phone] After hours/ weekend pager # [phone]

## 2014-09-05 NOTE — Op Note (Signed)
NAME:  Amy, Ward NO.:  [id number]  MEDICAL RECORD NO.:  [id number]  LOCATION:  [id number]                        FACILITY:  [facility]  PHYSICIAN:  Dyke Brackett, M.D.    DATE OF BIRTH:  [birth year]  DATE OF PROCEDURE:  Sep 12, 2014 DATE OF DISCHARGE:                              OPERATIVE REPORT   INDICATIONS:  A 81 year old with displaced right femoral neck fracture, thought to be amenable to hospitalization and hemiarthroplasty.  PREOPERATIVE DIAGNOSIS:  Displaced right femoral neck fracture.  POSTOPERATIVE DIAGNOSIS:  Displaced right femoral neck fracture.  OPERATION:  Right hip hemiarthroplasty (Basic Summit DePuy press-fit stem with size 2 femur, +0 neck length, and 42 mm hip ball).  SURGEON:  Dyke Brackett, M.D.  ASSISTANT:  Margart Sickles, PA-C.  BLOOD LOSS:  Approximately 150 mL.  ANESTHESIA:  Spinal anesthetic.  DESCRIPTION OF PROCEDURE:  Spinal anesthetic, lateral positioning, posterior approach to the hip made, splitting of the iliotibial band, careful protection of the sciatic nerve.  We identified the femoral neck fracture.  We removed the head.  There was some articular cartilage damage from the fracture to the acetabulum.  We then did a provisional neck cut with a second provisional neck cut due to the fact it was somewhat of a high neck fracture.  Head was sized to be 42 mm.  We then sized it with the Summit system to a size 2 broach.  Used the broach as a trial for a +0 neck length with a 42 mm hip ball.  Final prosthesis was inserted similarly with the above-mentioned parameters and sizing, excellent stability was noted, restoration of leg lengths.  The hip would only dislocate with extreme flexion, adduction, and internal rotation past 60 degrees.  Prior to insertion of the final prosthesis, the wound was irrigated.  Once the prosthesis was located, we had made a T capsulotomy of the hip, this was repaired with Tycron and #1 Tycron on the  fascia and the subcutaneous tissues with 2-0 Vicryl.  The skin with a stapling device.  Taken to the recovery room in stable condition.    Dyke Brackett, M.D.    WDC/MEDQ  D:  12-Sep-2014  T:  09-12-14  Job:  [id number]

## 2014-09-05 NOTE — ED Provider Notes (Signed)
CSN: [id number]     Arrival date & time 09/06/2014  0227 History   First MD Initiated Contact with Patient 09/06/14 0234     Chief Complaint  Patient presents with  . Fall    Fall from walking chair - c/o right shoulder and right leg pain.      (Consider location/radiation/quality/duration/timing/severity/associated sxs/prior Treatment) HPI  This is a 81 yo female with history hypertension, chronic kidney disease, malnutrition who presents following a fall. Patient reports a mechanical fall at her living facility. She states that her chair went out from underneath her. She fell onto her right side. She denies hitting her head or loss of consciousness. She does not take any anticoagulation. She reports right shoulder and right leg pain. Denies any chest pain or shortness of breath. Denies any syncope.  Past Medical History  Diagnosis Date  . Hypertension   . CKD (chronic kidney disease)   . Malnutrition    History reviewed. No pertinent past surgical history. No family history on file. History  Substance Use Topics  . Smoking status: Never Smoker   . Smokeless tobacco: Not on file  . Alcohol Use: Not on file   OB History    No data available     Review of Systems  Constitutional: Negative for fever.  Respiratory: Negative for cough, chest tightness and shortness of breath.   Cardiovascular: Negative for chest pain.  Gastrointestinal: Negative for nausea, vomiting and abdominal pain.  Genitourinary: Negative for dysuria.  Musculoskeletal: Negative for back pain.       Right shoulder, right hip pain  Skin: Positive for wound.  Neurological: Negative for syncope and headaches.  Psychiatric/Behavioral: Negative for confusion.  All other systems reviewed and are negative.     Allergies  Codeine  Home Medications   Prior to Admission medications   Medication Sig Start Date End Date Taking? Authorizing Provider  aspirin 81 MG chewable tablet Chew 81 mg by mouth daily.    Yes Historical Provider, MD  atorvastatin (LIPITOR) 80 MG tablet Take 80 mg by mouth daily.   Yes Historical Provider, MD  bisacodyl (DULCOLAX) 5 MG EC tablet Take 10 mg by mouth daily as needed for moderate constipation.   Yes Historical Provider, MD  Cholecalciferol (VITAMIN D-3) 1000 UNITS CAPS Take 1,000 Units by mouth daily.   Yes Historical Provider, MD  diazepam (VALIUM) 5 MG tablet Take 5 mg by mouth every 8 (eight) hours as needed for anxiety.   Yes Historical Provider, MD  diltiazem (DILACOR XR) 240 MG 24 hr capsule Take 240 mg by mouth daily.   Yes Historical Provider, MD  feeding supplement, GLUCERNA SHAKE, (GLUCERNA SHAKE) LIQD Take 237 mLs by mouth 4 (four) times daily.   Yes Historical Provider, MD  hydrALAZINE (APRESOLINE) 25 MG tablet Take 25 mg by mouth daily.   Yes Historical Provider, MD  HYDROcodone-acetaminophen (NORCO/VICODIN) 5-325 MG per tablet Take 1 tablet by mouth every 6 (six) hours as needed for moderate pain.   Yes Historical Provider, MD  ibuprofen (ADVIL,MOTRIN) 200 MG tablet Take 400 mg by mouth every 6 (six) hours as needed for fever or headache.   Yes Historical Provider, MD  insulin aspart (NOVOLOG) 100 UNIT/ML injection Inject 0-9 Units into the skin 3 (three) times daily before meals. Per sliding scale   Yes Historical Provider, MD  insulin glargine (LANTUS) 100 UNIT/ML injection Inject 20 Units into the skin daily.   Yes Historical Provider, MD  levothyroxine (SYNTHROID, LEVOTHROID) 25 MCG  tablet Take 25 mcg by mouth daily before breakfast.   Yes Historical Provider, MD  lipase/protease/amylase (CREON) 12000 UNITS CPEP capsule Take 72,000 Units by mouth 2 (two) times daily. 3 capsules in the morning and at bedtime.   Yes Historical Provider, MD  lipase/protease/amylase (CREON) 12000 UNITS CPEP capsule Take by mouth.   Yes Historical Provider, MD  loperamide (IMODIUM A-D) 2 MG tablet Take 2 mg by mouth every 4 (four) hours as needed for diarrhea or loose stools.    Yes Historical Provider, MD  magnesium oxide (MAG-OX) 400 MG tablet Take 400 mg by mouth 2 (two) times daily.   Yes Historical Provider, MD  metFORMIN (GLUCOPHAGE) 500 MG tablet Take by mouth 2 (two) times daily with a meal.   Yes Historical Provider, MD  morphine (MS CONTIN) 60 MG 12 hr tablet Take 60 mg by mouth every 12 (twelve) hours.   Yes Historical Provider, MD  omeprazole (PRILOSEC) 20 MG capsule Take 20 mg by mouth daily.   Yes Historical Provider, MD  ondansetron (ZOFRAN) 4 MG tablet Take 4 mg by mouth every 6 (six) hours as needed for nausea or vomiting.   Yes Historical Provider, MD  polyethylene glycol (MIRALAX / GLYCOLAX) packet Take 17 g by mouth 2 (two) times daily.   Yes Historical Provider, MD  potassium chloride (K-DUR,KLOR-CON) 10 MEQ tablet Take 10 mEq by mouth 2 (two) times daily.   Yes Historical Provider, MD  prochlorperazine (COMPAZINE) 5 MG tablet Take 5 mg by mouth every 6 (six) hours as needed for nausea or vomiting.   Yes Historical Provider, MD  sennosides-docusate sodium (SENOKOT-S) 8.6-50 MG tablet Take 2 tablets by mouth 2 (two) times daily.   Yes Historical Provider, MD  traMADol (ULTRAM) 50 MG tablet Take 50 mg by mouth every 6 (six) hours as needed for moderate pain.   Yes Historical Provider, MD  amoxicillin (AMOXIL) 500 MG capsule Take 1 capsule (500 mg total) by mouth 3 (three) times daily. Patient not taking: Reported on 09/01/2014 04/12/14   Tatyana Kirichenko, PA-C   BP 120/50 mmHg  Pulse 105  Temp(Src) 97.5 F (36.4 C) (Oral)  Resp 28  SpO2 97% Physical Exam  Constitutional: She is oriented to person, place, and time. No distress.  Frail, elderly  HENT:  Head: Normocephalic and atraumatic.  Eyes: Pupils are equal, round, and reactive to light.  Neck: Neck supple.  Cardiovascular: Normal rate, regular rhythm and normal heart sounds.   No murmur heard. Pulmonary/Chest: Effort normal and breath sounds normal. No respiratory distress. She has no  wheezes.  Abdominal: Soft. Bowel sounds are normal. There is no tenderness. There is no rebound.  Musculoskeletal: She exhibits no edema.  Tenderness over the right hip, leg noted to be foreshortened and slightly rotated, 2+ DP pulses, tenderness to palpation over the right scapula, overlying abrasion noted, no step off or deformity noted of the clavicle, shoulder appears intact with normal range of motion  Neurological: She is alert and oriented to person, place, and time.  Skin: Skin is warm and dry.  Psychiatric: She has a normal mood and affect.  Nursing note and vitals reviewed.   ED Course  Procedures (including critical care time) Labs Review Labs Reviewed  BASIC METABOLIC PANEL - Abnormal; Notable for the following:    Glucose, Bld 172 (*)    BUN 28 (*)    Creatinine, Ser 1.10 (*)    Calcium 8.8 (*)    GFR calc non Af Amer 43 (*)  GFR calc Af Amer 49 (*)    All other components within normal limits  CBC WITH DIFFERENTIAL/PLATELET - Abnormal; Notable for the following:    WBC 15.4 (*)    Platelets 71 (*)    Neutrophils Relative % 82 (*)    Neutro Abs 12.6 (*)    Lymphocytes Relative 5 (*)    Monocytes Relative 13 (*)    Monocytes Absolute 2.0 (*)    All other components within normal limits    Imaging Review Dg Chest 2 View  08/13/2014   CLINICAL DATA:  Fall from a walker, RIGHT hip in shoulder pain.  EXAM: CHEST  2 VIEW  COMPARISON:  Chest radiograph September 04, 2013  FINDINGS: Cardiac silhouette is normal in size, tortuous calcified aorta. No pleural effusion or focal consolidation. Stable probable granulomata. No pneumothorax. Patient is osteopenic. Old distal LEFT clavicle fracture. Soft tissue planes are nonsuspicious.  IMPRESSION: No active cardiopulmonary disease.   Electronically Signed   By: Awilda Metro M.D.   On: 09/04/2014 04:48   Dg Shoulder Right  09/11/2014   CLINICAL DATA:  Larey Seat from walker, RIGHT shoulder pain, no head injury or loss of consciousness.   EXAM: RIGHT SHOULDER - 2+ VIEW  COMPARISON:  None.  FINDINGS: Acute mildly displaced fracture of the lower scapula best seen on the Y-view. Humeral head is located. Patient is osteopenic without destructive bony lesions. Periarticular soft tissue planes are nonsuspicious. Mild vascular calcifications.  IMPRESSION: Acute mildly displaced lower scapular fracture.  No dislocation.   Electronically Signed   By: Awilda Metro M.D.   On: 09/04/2014 04:40   Dg Hip Unilat With Pelvis 2-3 Views Right  09/04/2014   CLINICAL DATA:  Fall from walker, RIGHT leg pain.  EXAM: RIGHT HIP (WITH PELVIS) 2-3 VIEWS  COMPARISON:  None.  FINDINGS: Acute oblique fracture of the RIGHT femoral neck with mild varus angulation distal bony fragment, femoral heads are located. No dislocation. Old appearing nondisplaced LEFT superior and inferior pubic ramus fracture. Patient is osteopenic without destructive bony lesions. Soft tissue planes are nonsuspicious.  IMPRESSION: Acute mildly angulated RIGHT femoral neck fracture without dislocation.  Chronic appearing nondisplaced LEFT superior and inferior pubic remain fractures, recommend correlation with point tenderness.  Osteopenia, decreasing sensitivity for acute nondisplaced fractures.   Electronically Signed   By: Awilda Metro M.D.   On: 09/06/2014 04:45     EKG Interpretation   Date/Time:  Friday September 05 2014 04:38:12 EDT Ventricular Rate:  80 PR Interval:  106 QRS Duration: 80 QT Interval:  376 QTC Calculation: 434 R Axis:   81 Text Interpretation:  Sinus rhythm Short PR interval Minimal ST elevation,  anterior leads Similar to prior Confirmed by Amarionna Arca  MD, [employer] ([id number])  on 08/21/2014 4:47:51 AM      MDM   Final diagnoses:  Left hip pain  Femur fracture, right, closed, initial encounter  Scapula fracture, right, closed, initial encounter  Abrasion    Patient presents following a fall. Described as mechanical fall. While elderly and frail  appearing, no significant past medical history patient lives independently at her assisted living facility. Lab work obtained. EKG is similar to prior. X-rays notable for a scapula fracture and a right femur fracture. Discussed with Dr. Dion Saucier, orthopedics. He is requesting transfer to [facility] for surgical repair. Discussed with Dr. Toniann Fail.  That request has been placed for medical bed at [facility], Dr. Allena Katz receiving.  Patient's son is Johanny Segers, power of attorney. He  would like to be contacted regarding plan of care for his mother. Phone number is area code [phone].      Shon Baton, MD 09/06/2014 [phone]

## 2014-09-05 NOTE — Anesthesia Procedure Notes (Signed)
Spinal Patient location during procedure: OR Start time: 08/17/2014 12:46 PM End time: 08/21/2014 12:56 PM Staffing Anesthesiologist: Heather Roberts Performed by: anesthesiologist  Preanesthetic Checklist Completed: patient identified, surgical consent, pre-op evaluation, timeout performed, IV checked, risks and benefits discussed and monitors and equipment checked Spinal Block Patient position: sitting Prep: Betadine Patient monitoring: cardiac monitor, continuous pulse ox and blood pressure Approach: midline Location: L3-4 Injection technique: single-shot Needle Needle type: Whitacre  Needle gauge: 25 G Needle length: 9 cm Additional Notes Functioning IV was confirmed and monitors were applied. Sterile prep and drape, including hand hygiene and sterile gloves were used. The patient was positioned and the spine was prepped. The skin was anesthetized with lidocaine.  Free flow of clear CSF was obtained prior to injecting local anesthetic into the CSF.  The spinal needle aspirated freely following injection.  The needle was carefully withdrawn.  The patient tolerated the procedure well.

## 2014-09-05 NOTE — ED Notes (Signed)
[facility] called for transport to [place] 

## 2014-09-05 NOTE — ED Notes (Signed)
Pt's son Yasmim Gillott can be reached at [phone](cell) and [phone](work)

## 2014-09-05 NOTE — ED Notes (Signed)
Report given to [facility]. 

## 2014-09-06 DIAGNOSIS — D693 Immune thrombocytopenic purpura: Secondary | ICD-10-CM

## 2014-09-06 DIAGNOSIS — R911 Solitary pulmonary nodule: Secondary | ICD-10-CM

## 2014-09-06 DIAGNOSIS — E43 Unspecified severe protein-calorie malnutrition: Secondary | ICD-10-CM

## 2014-09-06 LAB — PROTIME-INR
INR: 1.07 (ref 0.00–1.49)
Prothrombin Time: 14.1 seconds (ref 11.6–15.2)

## 2014-09-06 LAB — COMPREHENSIVE METABOLIC PANEL
ALT: 14 U/L (ref 14–54)
ANION GAP: 6 (ref 5–15)
AST: 30 U/L (ref 15–41)
Albumin: 2.7 g/dL — ABNORMAL LOW (ref 3.5–5.0)
Alkaline Phosphatase: 38 U/L (ref 38–126)
BILIRUBIN TOTAL: 0.4 mg/dL (ref 0.3–1.2)
BUN: 37 mg/dL — AB (ref 6–20)
CALCIUM: 7.6 mg/dL — AB (ref 8.9–10.3)
CO2: 27 mmol/L (ref 22–32)
Chloride: 104 mmol/L (ref 101–111)
Creatinine, Ser: 1.66 mg/dL — ABNORMAL HIGH (ref 0.44–1.00)
GFR calc Af Amer: 30 mL/min — ABNORMAL LOW (ref >60)
GFR calc non Af Amer: 26 mL/min — ABNORMAL LOW (ref >60)
GLUCOSE: 127 mg/dL — AB (ref 65–99)
POTASSIUM: 5.1 mmol/L (ref 3.5–5.1)
Sodium: 137 mmol/L (ref 135–145)
Total Protein: 4.7 g/dL — ABNORMAL LOW (ref 6.5–8.1)

## 2014-09-06 LAB — CBC WITH DIFFERENTIAL/PLATELET
BASOS ABS: 0 10*3/uL (ref 0.0–0.1)
BASOS PCT: 0 % (ref 0–1)
Basophils Absolute: 0 10*3/uL (ref 0.0–0.1)
Basophils Relative: 0 % (ref 0–1)
EOS ABS: 0 10*3/uL (ref 0.0–0.7)
EOS PCT: 0 % (ref 0–5)
EOS PCT: 0 % (ref 0–5)
Eosinophils Absolute: 0 10*3/uL (ref 0.0–0.7)
HCT: 24.3 % — ABNORMAL LOW (ref 36.0–46.0)
HEMATOCRIT: 24.1 % — AB (ref 36.0–46.0)
HEMOGLOBIN: 8 g/dL — AB (ref 12.0–15.0)
Hemoglobin: 8 g/dL — ABNORMAL LOW (ref 12.0–15.0)
LYMPHS PCT: 4 % — AB (ref 12–46)
Lymphocytes Relative: 3 % — ABNORMAL LOW (ref 12–46)
Lymphs Abs: 0.7 10*3/uL (ref 0.7–4.0)
Lymphs Abs: 0.7 10*3/uL (ref 0.7–4.0)
MCH: 29.2 pg (ref 26.0–34.0)
MCH: 29 pg (ref 26.0–34.0)
MCHC: 32.9 g/dL (ref 30.0–36.0)
MCHC: 33.2 g/dL (ref 30.0–36.0)
MCV: 88.7 fL (ref 78.0–100.0)
MCV: 87.3 fL (ref 78.0–100.0)
MONO ABS: 2.1 10*3/uL — AB (ref 0.1–1.0)
MONOS PCT: 9 % (ref 3–12)
Monocytes Absolute: 1.7 10*3/uL — ABNORMAL HIGH (ref 0.1–1.0)
Monocytes Relative: 9 % (ref 3–12)
NEUTROS ABS: 16.1 10*3/uL — AB (ref 1.7–7.7)
NEUTROS ABS: 20.4 10*3/uL — AB (ref 1.7–7.7)
Neutrophils Relative %: 87 % — ABNORMAL HIGH (ref 43–77)
Neutrophils Relative %: 88 % — ABNORMAL HIGH (ref 43–77)
Platelets: 52 10*3/uL — ABNORMAL LOW (ref 150–400)
Platelets: 47 10*3/uL — ABNORMAL LOW (ref 150–400)
RBC: 2.74 MIL/uL — AB (ref 3.87–5.11)
RBC: 2.76 MIL/uL — AB (ref 3.87–5.11)
RDW: 13.9 % (ref 11.5–15.5)
RDW: 13.9 % (ref 11.5–15.5)
WBC: 18.5 10*3/uL — ABNORMAL HIGH (ref 4.0–10.5)
WBC: 23.2 10*3/uL — ABNORMAL HIGH (ref 4.0–10.5)

## 2014-09-06 LAB — URINALYSIS, ROUTINE W REFLEX MICROSCOPIC
Bilirubin Urine: NEGATIVE
GLUCOSE, UA: NEGATIVE mg/dL
Hgb urine dipstick: NEGATIVE
Ketones, ur: NEGATIVE mg/dL
LEUKOCYTES UA: NEGATIVE
Nitrite: NEGATIVE
PH: 5.5 (ref 5.0–8.0)
Protein, ur: NEGATIVE mg/dL
Specific Gravity, Urine: 1.022 (ref 1.005–1.030)
UROBILINOGEN UA: 0.2 mg/dL (ref 0.0–1.0)

## 2014-09-06 LAB — VITAMIN D 25 HYDROXY (VIT D DEFICIENCY, FRACTURES): VIT D 25 HYDROXY: 30.3 ng/mL (ref 30.0–100.0)

## 2014-09-06 MED ORDER — WARFARIN VIDEO: Freq: Once | Status: DC

## 2014-09-06 MED ORDER — COUMADIN BOOK
Freq: Once | Status: DC
  Filled 2014-09-06: qty 1

## 2014-09-06 MED ORDER — WARFARIN - PHARMACIST DOSING INPATIENT: Freq: Every day | Status: DC

## 2014-09-06 MED ORDER — WARFARIN SODIUM 3 MG PO TABS
3.0000 mg | ORAL_TABLET | Freq: Once | ORAL | Status: DC
  Filled 2014-09-06: qty 1

## 2014-09-06 NOTE — Progress Notes (Signed)
Subjective: 1 Day Post-Op Procedure(s) (LRB): ARTHROPLASTY BIPOLAR HIP (HEMIARTHROPLASTY) (Right) Patient reports pain as moderate.    Objective: Vital signs in last 24 hours: Temp:  [97.7 F (36.5 C)-98.4 F (36.9 C)] 97.7 F (36.5 C) (06/25 0526) Pulse Rate:  [77-96] 82 (06/25 0526) Resp:  [16-29] 17 (06/25 0526) BP: (92-111)/(34-57) 108/38 mmHg (06/25 0526) SpO2:  [94 %-100 %] 100 % (06/25 0526)  Intake/Output from previous day: 06/24 0701 - 06/25 0700 In: 1500 [I.V.:1500] Out: 200 [Blood:200] Intake/Output this shift:     Recent Labs  09/08/2014 0346 09/06/2014 1100 09/06/14 0440  HGB 13.1 12.0 8.0*    Recent Labs  08/21/2014 1100 09/06/14 0440  WBC 15.3* 18.5*  RBC 4.17 2.74*  HCT 36.8 24.3*  PLT 68* 52*    Recent Labs  08/13/2014 1100 09/06/14 0440  NA 141 137  K 5.6* 5.1  CL 104 104  CO2 28 27  BUN 28* 37*  CREATININE 1.36* 1.66*  GLUCOSE 145* 127*  CALCIUM 8.7* 7.6*    Recent Labs  08/26/2014 1100 09/06/14 0440  INR 1.04 1.07    Neurovascular intact Incision: dressing C/D/I No cellulitis present  Assessment/Plan: 1 Day Post-Op Procedure(s) (LRB): ARTHROPLASTY BIPOLAR HIP (HEMIARTHROPLASTY) (Right) Up with therapy  Straight cath to obtain urine sample UA/cx evaluate elevated WBC count Monitor Hgb closely, drop from 12-8 today consider transfusion, will await medicine's recommendation WBAT Posterior hip precautions dvt proph Pain med as ordered   Margart Sickles 09/06/2014, 10:49 AM

## 2014-09-06 NOTE — Plan of Care (Signed)
Problem: Consults Goal: Diagnosis- Total Joint Replacement Hemiarthroplasty     

## 2014-09-06 NOTE — Progress Notes (Signed)
Amy Ward GHW:[id number] DOB: 1921/05/08 DOA: 08/20/2014 PCP: Darnelle Bos, MD  Brief narrative:  81 y/o nursing home resident with a bland medical history other than tachycardia admitted to the hospital early on 09/01/14 with fall and hip fracture at ALF where she stays She is very HOH but family tells me she lives pretty independently att facility and is relatively high functioning She seems to be doing well post-op I have d/c her IV morphine/Reglans and placed her on scheduled Tramadol as she will need pain control-this is NOT to be given if patient is asleep   Past medical history-As per Problem list Chart reviewed as below-  Consultants:  Ortho  Procedures:  Hemiarthroplasty 6/24  Antibiotics:  none   Subjective   Somewhat confused this am No other issues per RN tolerating lunch   Objective    Interim History:   Telemetry:    Objective: Filed Vitals:   08/19/2014 1627 08/21/2014 1915 09/06/14 0028 09/06/14 0526  BP: 104/51 100/41 98/40 108/38  Pulse: 77 96 92 82  Temp: 98.1 F (36.7 C) 98.4 F (36.9 C) 98.4 F (36.9 C) 97.7 F (36.5 C)  TempSrc: Oral Oral Oral Axillary  Resp: 20 17 16 17   Height:      Weight:      SpO2: 100% 94% 98% 100%    Intake/Output Summary (Last 24 hours) at 09/06/14 1205 Last data filed at 09/08/2014 1437  Gross per 24 hour  Intake   1500 ml  Output    200 ml  Net   1300 ml    Exam:  General: eomi, ncat-arcus Cardiovascular: s1 s 2no /r/g Respiratory:  clear Abdomen: soft nt,nd Skin no le edema Neuro grossy intact but confused  Data Reviewed: Basic Metabolic Panel:  Recent Labs Lab 09/11/2014 0346 09/04/2014 1100 09/06/14 0440  NA 142 141 137  K 4.4 5.6* 5.1  CL 102 104 104  CO2 30 28 27   GLUCOSE 172* 145* 127*  BUN 28* 28* 37*  CREATININE 1.10* 1.36* 1.66*  CALCIUM 8.8* 8.7* 7.6*   Liver Function Tests:  Recent Labs Lab 08/24/2014 1100 09/06/14 0440  AST 27 30  ALT 24 14  ALKPHOS 43 38    BILITOT 0.6 0.4  PROT 5.4* 4.7*  ALBUMIN 3.2* 2.7*   No results for input(s): LIPASE, AMYLASE in the last 168 hours. No results for input(s): AMMONIA in the last 168 hours. CBC:  Recent Labs Lab 08/23/2014 0346 08/21/2014 1100 09/06/14 0440  WBC 15.4* 15.3* 18.5*  NEUTROABS 12.6*  --  16.1*  HGB 13.1 12.0 8.0*  HCT 39.3 36.8 24.3*  MCV 87.9 88.2 88.7  PLT 71* 68* 52*   Cardiac Enzymes: No results for input(s): CKTOTAL, CKMB, CKMBINDEX, TROPONINI in the last 168 hours. BNP: Invalid input(s): POCBNP CBG: No results for input(s): GLUCAP in the last 168 hours.  Recent Results (from the past 240 hour(s))  Surgical pcr screen     Status: None   Collection Time: 08/14/2014 11:34 AM  Result Value Ref Range Status   MRSA, PCR NEGATIVE NEGATIVE Final   Staphylococcus aureus NEGATIVE NEGATIVE Final    Comment:        The Xpert SA Assay (FDA approved for NASAL specimens in patients over 2 years of age), is one component of a comprehensive surveillance program.  Test performance has been validated by [facility] for patients greater than or equal to 42 year old. It is not intended to diagnose infection nor to guide or  monitor treatment.      Studies:              All Imaging reviewed and is as per above notation   Scheduled Meds: . coumadin book   Does not apply Once  . docusate sodium  100 mg Oral BID  . feeding supplement (ENSURE ENLIVE)  237 mL Oral BID BM  . metoprolol tartrate  12.5 mg Oral Daily  . traMADol  50 mg Oral 4 times per day  . warfarin  3 mg Oral ONCE-1800  . warfarin   Does not apply Once  . Warfarin - Pharmacist Dosing Inpatient   Does not apply q1800   Continuous Infusions: . sodium chloride 10 mL/hr at 2014/09/21 1211  . sodium chloride 75 mL/hr at 09/06/14 0850  . lactated ringers       Assessment/Plan:  Hip #-s/p R Hemiarthroplasty 6/24 1. per discretion Ortho services-appreciate their input into   Anticoagulation post-op  Weight bearing  tolerance for therapy services  Wound care  Pain management Follow-up services required  Abla 2/2 surgery Recheck Hemoglobin again today given precipitous drop Transfusion threshold is ?Hb of 7 for anyone without CAD/ASCVD h/o  Tachycardia  Metoprolol 12.5 initally held this am Nursing given parameters to hold and instructed to admin  AKI + hyperkalemia 2/2 to Abla Monitor Hb Transfuse as necessary Continue IV saline  Leukocytosis -post-op day 1 No obj TMAX Use IS and Ge tCXR in am Wound clean Urine culture ordered by Ortho  TCP Get PLT smear Consider not using Coumadin going forward? Consider lepirudin until we can figure out the etiology of TCP  Dementia Hold sedating meds   Code Status: full  Family Communication: no family + today Disposition Plan: SNF   Pleas Koch, MD  [facility] Pager [phone] 09/06/2014, 12:05 PM    LOS: 1 day

## 2014-09-06 NOTE — Care Management Note (Signed)
Case Management Note  Patient Details  Name: Amy Ward MRN: [id number] Date of Birth: February 23, 1922  Subjective/Objective:                  Displaced right femoral neck fracture. Right hip hemiarthroplasty  Action/Plan: PT/OT recommendation is for patient to go to SNF. SW consult already entered by MD. CM available for any additional CM discharge planning needs.   Expected Discharge Date:                  Expected Discharge Plan:  Skilled Nursing Facility  In-House Referral:  Clinical Social Work  Discharge planning Services  CM Consult  Post Acute Care Choice:    Choice offered to:     DME Arranged:    DME Agency:     HH Arranged:    HH Agency:     Status of Service:  In process, will continue to follow  Medicare Important Message Given:    Date Medicare IM Given:    Medicare IM give by:    Date Additional Medicare IM Given:    Additional Medicare Important Message give by:     If discussed at Long Length of Stay Meetings, dates discussed:    Additional Comments:  Darcel Smalling, RN 09/06/2014, 5:31 PM

## 2014-09-06 NOTE — Progress Notes (Signed)
ANTICOAGULATION CONSULT NOTE - Initial Consult  Pharmacy Consult for coumadin Indication: VTE prophylaxis  Allergies  Allergen Reactions  . Codeine Other (See Comments)    unknown    Patient Measurements: Height: 5\' 2"  (157.5 cm) (family report) Weight: 100 lb (45.36 kg) IBW/kg (Calculated) : 50.1   Vital Signs: Temp: 97.7 F (36.5 C) (06/25 0526) Temp Source: Axillary (06/25 0526) BP: 108/38 mmHg (06/25 0526) Pulse Rate: 82 (06/25 0526)  Labs:  Recent Labs  27-Sep-2014 0346 09-27-2014 1100 09/06/14 0440  HGB 13.1 12.0 8.0*  HCT 39.3 36.8 24.3*  PLT 71* 68* 52*  LABPROT  --  13.8 14.1  INR  --  1.04 1.07  CREATININE 1.10* 1.36* 1.66*    Estimated Creatinine Clearance: 15.8 mL/min (by C-G formula based on Cr of 1.66).   Medical History: Past Medical History  Diagnosis Date  . Hypertension   . CKD (chronic kidney disease)   . Malnutrition     Medications:  Prescriptions prior to admission  Medication Sig Dispense Refill Last Dose  . Cholecalciferol (VITAMIN D-3) 1000 UNITS CAPS Take 1,000 Units by mouth daily.   09/04/2014 at 0800  . metoprolol tartrate (LOPRESSOR) 25 MG tablet Take 15 mg by mouth daily.  0 09/04/2014 at 2100  . amoxicillin (AMOXIL) 500 MG capsule Take 1 capsule (500 mg total) by mouth 3 (three) times daily. (Patient not taking: Reported on 2014-09-27) 21 capsule 0   . traMADol (ULTRAM) 50 MG tablet Take 50 mg by mouth every 6 (six) hours as needed for moderate pain.       Assessment: 81 yo lady to start coumadin for VTE px s/p R hip hemiarthroplasty.  Her Hg has dropped today to 8.0, awaiting medicine's recommendation. Goal of Therapy:  INR 2-3 Monitor platelets by anticoagulation protocol: Yes   Plan:  Coumadin 3 mg po today Daily PT/INR  Thanks for allowing pharmacy to be a part of this patient's care.  Talbert Cage, PharmD Clinical Pharmacist, [phone]  09/06/2014,11:37 AM

## 2014-09-06 NOTE — Evaluation (Signed)
Physical Therapy Evaluation Patient Details Name: Amy Ward MRN: [id number] DOB: Dec 24, 1921 Today's Date: 09/06/2014   History of Present Illness  Pt s/p fall and now s/p right hip hemi-arthoplasty as well as scapula fx treated conservatively  Clinical Impression  Pt admitted with above diagnosis. Mobility limited on eval by pain and fatigue. Pt will benefit from skilled PT to increase their independence and safety with mobility to allow discharge to the venue listed below.       Follow Up Recommendations SNF;Supervision/Assistance - 24 hour    Equipment Recommendations  None recommended by PT    Recommendations for Other Services       Precautions / Restrictions Precautions Precautions: Posterior Hip;Fall Precaution Booklet Issued: No Precaution Comments: Educated pt and family memeber on 3/3 hip precautions. Restrictions Weight Bearing Restrictions: Yes RUE Weight Bearing: Weight bearing as tolerated RLE Weight Bearing: Weight bearing as tolerated      Mobility  Bed Mobility Overal bed mobility: Needs Assistance Bed Mobility: Sit to Supine   Sidelying to sit: Max assist;HOB elevated (use of chuck pad)   Sit to supine: Max assist      Transfers Overall transfer level: Needs assistance Equipment used: None Transfers: Stand Pivot Transfers Sit to Stand: Mod assist Stand pivot transfers: Mod assist       General transfer comment: continuous verbal cues for sequencing. pivoted to left.  Ambulation/Gait                Stairs            Wheelchair Mobility    Modified Rankin (Stroke Patients Only)       Balance Overall balance assessment: Needs assistance Sitting-balance support: Bilateral upper extremity supported;Feet supported Sitting balance-Leahy Scale: Poor     Standing balance support: Bilateral upper extremity supported Standing balance-Leahy Scale: Poor                               Pertinent Vitals/Pain Pain  Assessment: 0-10 Pain Score: 5  Pain Location: RLE and R shoulder Pain Descriptors / Indicators: Aching Pain Intervention(s): Monitored during session;Limited activity within patient's tolerance;Repositioned    Home Living Family/patient expects to be discharged to:: Skilled nursing facility                 Additional Comments: Pt was from [facility]--has been there about a year    Prior Function Level of Independence: Independent with assistive device(s)         Comments: Uses walker normally (not sure if RW or rollator)     Hand Dominance   Dominant Hand: Right    Extremity/Trunk Assessment   Upper Extremity Assessment: Overall WFL for tasks assessed           Lower Extremity Assessment: Defer to PT evaluation         Communication   Communication: HOH  Cognition Arousal/Alertness: Awake/alert Behavior During Therapy: WFL for tasks assessed/performed Overall Cognitive Status: History of cognitive impairments - at baseline       Memory: Decreased recall of precautions              General Comments      Exercises        Assessment/Plan    PT Assessment Patient needs continued PT services  PT Diagnosis Difficulty walking;Generalized weakness;Acute pain   PT Problem List Decreased strength;Decreased activity tolerance;Decreased balance;Decreased mobility;Pain;Decreased knowledge of precautions;Decreased safety awareness;Decreased cognition;Decreased knowledge  of use of DME  PT Treatment Interventions DME instruction;Gait training;Functional mobility training;Therapeutic activities;Therapeutic exercise;Patient/family education;Cognitive remediation;Balance training   PT Goals (Current goals can be found in the Care Plan section) Acute Rehab PT Goals Patient Stated Goal: to go back to [facility] PT Goal Formulation: With patient/family Time For Goal Achievement: 09/20/14 Potential to Achieve Goals: Good    Frequency Min  3X/week   Barriers to discharge        Co-evaluation               End of Session Equipment Utilized During Treatment: Gait belt Activity Tolerance: Patient limited by fatigue;Patient limited by pain Patient left: in bed;with call bell/phone within reach;with family/visitor present;with bed alarm set Nurse Communication: Mobility status         Time: 7341-9379 PT Time Calculation (min) (ACUTE ONLY): 23 min   Charges:   PT Evaluation $Initial PT Evaluation Tier I: 1 Procedure PT Treatments $Therapeutic Activity: 8-22 mins   PT G Codes:        Ilda Foil 09/06/2014, 4:26 PM

## 2014-09-06 NOTE — Progress Notes (Signed)
Orthopedic Tech Progress Note Patient Details:  Amy Ward 03-04-1922 [id number]  Patient ID: [name], female   DOB: Jul 31, 1921, 81 y.o.   MRN: [id number] Pt unable to use trapeze bar patient helper  Nikki Dom 09/06/2014, 7:28 AM

## 2014-09-06 NOTE — Evaluation (Addendum)
Occupational Therapy Evaluation and Discharge Patient Details Name: Amy Ward MRN: [id number] DOB: 1921-08-13 Today's Date: 09/06/2014    History of Present Illness Pt s/p fall and now s/p right hip hemi-arthoplasty as well as scapula fx treated conservatively   Clinical Impression   This 81 yo female admitted and underwent above presents to acute OT with decreased movement of RLE adn RUE, decreased mobility, decreased balance, and decreased cognition all affecting her ability to care for herself. She will benefit from continued OT at SNF to get to as Independent/ Mod I as possible. We will D/C from acute OT.    Follow Up Recommendations  SNF    Equipment Recommendations   (TBD at next venue)       Precautions / Restrictions Precautions Precautions: Posterior Hip;Fall Precaution Booklet Issued: No Restrictions Weight Bearing Restrictions: Yes RUE Weight Bearing: Weight bearing as tolerated RLE Weight Bearing: Weight bearing as tolerated      Mobility Bed Mobility Overal bed mobility: Needs Assistance Bed Mobility: Supine to Sit   Sidelying to sit: Max assist;HOB elevated (use of chuck pad)          Transfers Overall transfer level: Needs assistance   Transfers: Stand Pivot Transfers;Sit to/from Stand Sit to Stand: Mod assist (with therapist in front of her) Stand pivot transfers: Mod assist (with therapist in front of her)            Balance Overall balance assessment: Needs assistance Sitting-balance support: Bilateral upper extremity supported;Feet supported Sitting balance-Leahy Scale: Poor     Standing balance support: Bilateral upper extremity supported Standing balance-Leahy Scale: Poor                              ADL Overall ADL's : Needs assistance/impaired Eating/Feeding: Set up;Supervision/ safety;Sitting   Grooming: Set up;Supervision/safety;Sitting   Upper Body Bathing: Set up;Supervision/ safety;Sitting   Lower Body  Bathing: Maximal assistance (with Mod A sit<>stand)   Upper Body Dressing : Minimal assistance;Sitting   Lower Body Dressing: Total assistance (with Mod A sit<>stand)   Toilet Transfer: Moderate assistance;Stand-pivot;BSC   Toileting- Clothing Manipulation and Hygiene: Total assistance (with additional person Mod A sit<>stand)               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          Pertinent Vitals/Pain Pain Assessment: 0-10 Pain Score: 5  Pain Location: RLE  Pain Descriptors / Indicators: Aching;Sore Pain Intervention(s): Monitored during session;Repositioned;Patient requesting pain meds-RN notified;RN gave pain meds during session     Hand Dominance Right   Extremity/Trunk Assessment Upper Extremity Assessment Upper Extremity Assessment: Overall WFL for tasks assessed   Lower Extremity Assessment Lower Extremity Assessment: Defer to PT evaluation       Communication Communication Communication: HOH (talks alot)   Cognition Arousal/Alertness: Awake/alert Behavior During Therapy: WFL for tasks assessed/performed Overall Cognitive Status: History of cognitive impairments - at baseline                                Home Living Family/patient expects to be discharged to:: Skilled nursing facility                                 Additional Comments: Pt was from [facility]--has been there about a year  Prior Functioning/Environment          Comments: Uses walker normally (not sure if RW or rollator)    OT Diagnosis: Generalized weakness;Cognitive deficits;Acute pain   OT Problem List: Decreased strength;Decreased range of motion;Decreased activity tolerance;Impaired balance (sitting and/or standing);Pain;Decreased cognition;Decreased knowledge of use of DME or AE;Impaired vision/perception      OT Goals(Current goals can be found in the care plan section) Acute Rehab OT Goals Patient Stated Goal: to  go back to [facility] OT Goal Formulation: With patient Time For Goal Achievement: 09/13/14 Potential to Achieve Goals: Good  OT Frequency:                End of Session Equipment Utilized During Treatment: Gait belt Nurse Communication:  (PT to get pt back to bed)  Activity Tolerance: Patient limited by pain Patient left: in chair;with family/visitor present   Time: 3235-5732 OT Time Calculation (min): 23 min Charges:  OT General Charges $OT Visit: 1 Procedure OT Evaluation $Initial OT Evaluation Tier I: 1 Procedure OT Treatments $Self Care/Home Management : 8-22 mins  Evette Georges [phone] 09/06/2014, 3:10 PM

## 2014-09-07 LAB — BASIC METABOLIC PANEL
ANION GAP: 5 (ref 5–15)
BUN: 31 mg/dL — AB (ref 6–20)
CALCIUM: 8.1 mg/dL — AB (ref 8.9–10.3)
CHLORIDE: 101 mmol/L (ref 101–111)
CO2: 29 mmol/L (ref 22–32)
Creatinine, Ser: 0.95 mg/dL (ref 0.44–1.00)
GFR calc Af Amer: 59 mL/min — ABNORMAL LOW (ref >60)
GFR calc non Af Amer: 51 mL/min — ABNORMAL LOW (ref >60)
GLUCOSE: 116 mg/dL — AB (ref 65–99)
Potassium: 4.8 mmol/L (ref 3.5–5.1)
Sodium: 135 mmol/L (ref 135–145)

## 2014-09-07 LAB — RETICULOCYTES
RBC.: 3.2 MIL/uL — AB (ref 3.87–5.11)
RETIC COUNT ABSOLUTE: 60.8 10*3/uL (ref 19.0–186.0)
Retic Ct Pct: 1.9 % (ref 0.4–3.1)

## 2014-09-07 LAB — TECHNOLOGIST SMEAR REVIEW

## 2014-09-07 LAB — URINE CULTURE: CULTURE: NO GROWTH

## 2014-09-07 LAB — CBC WITH DIFFERENTIAL/PLATELET
BASOS ABS: 0 10*3/uL (ref 0.0–0.1)
Basophils Relative: 0 % (ref 0–1)
EOS PCT: 0 % (ref 0–5)
Eosinophils Absolute: 0 10*3/uL (ref 0.0–0.7)
HEMATOCRIT: 20.4 % — AB (ref 36.0–46.0)
Hemoglobin: 6.8 g/dL — CL (ref 12.0–15.0)
LYMPHS PCT: 5 % — AB (ref 12–46)
Lymphs Abs: 0.6 10*3/uL — ABNORMAL LOW (ref 0.7–4.0)
MCH: 28.9 pg (ref 26.0–34.0)
MCHC: 33.3 g/dL (ref 30.0–36.0)
MCV: 86.8 fL (ref 78.0–100.0)
Monocytes Absolute: 1.1 10*3/uL — ABNORMAL HIGH (ref 0.1–1.0)
Monocytes Relative: 8 % (ref 3–12)
NEUTROS PCT: 88 % — AB (ref 43–77)
Neutro Abs: 12 10*3/uL — ABNORMAL HIGH (ref 1.7–7.7)
Platelets: 40 10*3/uL — ABNORMAL LOW (ref 150–400)
RBC: 2.35 MIL/uL — ABNORMAL LOW (ref 3.87–5.11)
RDW: 13.7 % (ref 11.5–15.5)
WBC: 13.7 10*3/uL — ABNORMAL HIGH (ref 4.0–10.5)

## 2014-09-07 LAB — PREPARE RBC (CROSSMATCH)

## 2014-09-07 LAB — FOLATE: Folate: 10.4 ng/mL (ref >5.9)

## 2014-09-07 LAB — IRON AND TIBC
Iron: 32 ug/dL (ref 28–170)
Saturation Ratios: 12 % (ref 10.4–31.8)
TIBC: 267 ug/dL (ref 250–450)
UIBC: 235 ug/dL

## 2014-09-07 LAB — OCCULT BLOOD X 1 CARD TO LAB, STOOL: FECAL OCCULT BLD: NEGATIVE

## 2014-09-07 LAB — VITAMIN B12: Vitamin B-12: 463 pg/mL (ref 180–914)

## 2014-09-07 LAB — ABO/RH: ABO/RH(D): A POS

## 2014-09-07 LAB — FERRITIN: FERRITIN: 61 ng/mL (ref 11–307)

## 2014-09-07 MED ORDER — TRAMADOL HCL 50 MG PO TABS: 50.0000 mg | ORAL_TABLET | ORAL | Status: AC | PRN

## 2014-09-07 MED ORDER — SENNOSIDES-DOCUSATE SODIUM 8.6-50 MG PO TABS: 1.0000 | ORAL_TABLET | Freq: Every evening | ORAL | Status: AC | PRN

## 2014-09-07 MED ORDER — TRAMADOL HCL 50 MG PO TABS: ORAL_TABLET | ORAL | Status: DC

## 2014-09-07 MED ORDER — DOCUSATE SODIUM 100 MG PO CAPS: 100.0000 mg | ORAL_CAPSULE | Freq: Two times a day (BID) | ORAL | Status: AC

## 2014-09-07 MED ORDER — ACETAMINOPHEN 325 MG PO TABS: 650.0000 mg | ORAL_TABLET | Freq: Four times a day (QID) | ORAL | Status: AC | PRN

## 2014-09-07 MED ORDER — SODIUM CHLORIDE 0.9 % IV SOLN: Freq: Once | INTRAVENOUS | Status: DC

## 2014-09-07 MED ORDER — SODIUM CHLORIDE 0.9 % IV BOLUS (SEPSIS)
500.0000 mL | Freq: Once | INTRAVENOUS | Status: AC
  Administered 2014-09-07: 500 mL via INTRAVENOUS

## 2014-09-07 NOTE — Significant Event (Signed)
Rapid Response Event Note    While making round on floor I was  asked by Arleta Creek, RN to see pt  With rapid heart rate.  Overview: Time Called: 2015 Arrival Time: 2015 Event Type: Cardiac  Initial Focused Assessment:  On arrival into room pt lying in bed, pleasant, cooperative.  Oriented to person, place only.  Denies, Chest pain, or other pain, denies SOB, palpitations.   VS: 99.4   144/53  132 16  96% RA   Skin warm and dry  Bilateral BS diminished throughout.  S1 S2 tachycaridic 132. abd soft, nontender, active bowel sounds   Interventions:   12 lead EKG done indicating Sinus tachycardia, rate 132.  Requested  RN  Inform   Tama Gander, NP of pt status and EKG.    Will continue to monitor as neecessary   Event Summary:  Tama Gander, NP      at  2030  Outcome: Stayed in room and stabalized     Darnise Montag, [place]

## 2014-09-07 NOTE — Progress Notes (Signed)
Subjective: 2 Days Post-Op Procedure(s) (LRB): ARTHROPLASTY BIPOLAR HIP (HEMIARTHROPLASTY) (Right) Patient reports pain as mild.  Pt lethargic and with some confusion.  Objective: Vital signs in last 24 hours: Temp:  [98.5 F (36.9 C)] 98.5 F (36.9 C) (06/26 0526) Pulse Rate:  [86-115] 115 (06/26 0817) Resp:  [16-17] 16 (06/26 0526) BP: (117-128)/(30-43) 120/35 mmHg (06/26 0817) SpO2:  [97 %-100 %] 100 % (06/26 0526)  Intake/Output from previous day: 06/25 0701 - 06/26 0700 In: 50 [P.O.:50] Out: -  Intake/Output this shift: Total I/O In: 25 [P.O.:25] Out: -    Recent Labs  08/16/2014 0346 08/15/2014 1100 09/06/14 0440 09/06/14 1530 09/07/14 0405  HGB 13.1 12.0 8.0* 8.0* 6.8*    Recent Labs  09/06/14 1530 09/07/14 0405  WBC 23.2* 13.7*  RBC 2.76* 2.35*  HCT 24.1* 20.4*  PLT 47* 40*    Recent Labs  09/06/14 0440 09/07/14 0405  NA 137 135  K 5.1 4.8  CL 104 101  CO2 27 29  BUN 37* 31*  CREATININE 1.66* 0.95  GLUCOSE 127* 116*  CALCIUM 7.6* 8.1*    Recent Labs  09/11/2014 1100 09/06/14 0440  INR 1.04 1.07    Neurovascular intact Sensation intact distally Intact pulses distally Dorsiflexion/Plantar flexion intact Incision: dressing C/D/I and no drainage No cellulitis present  Assessment/Plan: 2 Days Post-Op Procedure(s) (LRB): ARTHROPLASTY BIPOLAR HIP (HEMIARTHROPLASTY) (Right) WBAT RLE, should transfuse first prior to attempts to get OOB with PT Tramadol as ordered for pain control prn  Hgb 6.8 today spoke with nurse they have not yet done the transfusion awaiting bloodbank will proceed once received Blood loss with surgery was minimal unclear why the drastic drop in Hgb appreciate medicine participation in care  anticoags d/c secondary to ABLA and TCP As far as long term need for anticoagulation for VTE proph, typically we would use x55month following surgery would appreciate input from medicine team when or if they feel this would be necessary  pending more normal blood counts   Jahnae Mcadoo 09/07/2014, 10:12 AM

## 2014-09-07 NOTE — Progress Notes (Signed)
CRITICAL VALUE ALERT  Critical value received: hgb 6.8  Date of notification:  6/26  Time of notification:  0440  Critical value read back:Yes.    Nurse who received alert:  H. Alverda Skeans  MD notified (1st page):  Burnadette Peter  Time of first page:  0450  MD notified (2nd page):  Time of second page:  Responding MD:  Burnadette Peter NP  Time MD responded:  [phone]

## 2014-09-07 NOTE — Progress Notes (Signed)
Amy Ward ZOX:[id number] DOB: September 17, 1921 DOA: 08/18/2014 PCP: Darnelle Bos, MD  Brief narrative:  81 y/o nursing home resident with a bland medical history other than tachycardia admitted to the hospital early on 09/01/14 with fall and hip fracture at ALF where she stays She is very HOH but family tells me she lives pretty independently att facility and is relatively high functioning She seems to be doing well post-op I have d/c her IV morphine/Reglans and placed her on scheduled Tramadol as she will need pain control-this is NOT to be given if patient is asleep  She was found to have ABLA and transfused with platelets dropping into the 40's Hematology consulted  Past medical history-As per Problem list Chart reviewed as below-  Consultants:  Ortho  Procedures:  Hemiarthroplasty 6/24  Antibiotics:  none   Subjective   Confused still but s little better No stool since admit No n/v/cp Has some bruising to arm and hematoma to forearm   Objective    Interim History:   Telemetry:    Objective: Filed Vitals:   09/07/14 0817 09/07/14 1136 09/07/14 1204 09/07/14 1220  BP: 120/35 118/34 111/35 119/38  Pulse: 115 85 81 93  Temp:   98.6 F (37 C) 98 F (36.7 C)  TempSrc:   Axillary Axillary  Resp:      Height:      Weight:      SpO2:   100% 100%    Intake/Output Summary (Last 24 hours) at 09/07/14 1427 Last data filed at 09/07/14 0900  Gross per 24 hour  Intake     50 ml  Output      0 ml  Net     50 ml    Exam:  General: eomi, ncat-arcus Cardiovascular: s1 s 2no /r/g Respiratory:  clear Abdomen: soft nt,nd Skin no le edema-bruising throughout Neuro grossy intact but confused  Data Reviewed: Basic Metabolic Panel:  Recent Labs Lab 08/29/2014 0346 09/04/2014 1100 09/06/14 0440 09/07/14 0405  NA 142 141 137 135  K 4.4 5.6* 5.1 4.8  CL 102 104 104 101  CO2 GLUCOSE 172* 145* 127* 116*  BUN 28* 28* 37* 31*  CREATININE  1.10* 1.36* 1.66* 0.95  CALCIUM 8.8* 8.7* 7.6* 8.1*   Liver Function Tests:  Recent Labs Lab 08/15/2014 1100 09/06/14 0440  AST 27 30  ALT 24 14  ALKPHOS 43 38  BILITOT 0.6 0.4  PROT 5.4* 4.7*  ALBUMIN 3.2* 2.7*   No results for input(s): LIPASE, AMYLASE in the last 168 hours. No results for input(s): AMMONIA in the last 168 hours. CBC:  Recent Labs Lab 08/25/2014 0346 08/27/2014 1100 09/06/14 0440 09/06/14 1530 09/07/14 0405  WBC 15.4* 15.3* 18.5* 23.2* 13.7*  NEUTROABS 12.6*  --  16.1* 20.4* 12.0*  HGB 13.1 12.0 8.0* 8.0* 6.8*  HCT 39.3 36.8 24.3* 24.1* 20.4*  MCV 87.9 88.2 88.7 87.3 86.8  PLT 71* 68* 52* 47* 40*   Cardiac Enzymes: No results for input(s): CKTOTAL, CKMB, CKMBINDEX, TROPONINI in the last 168 hours. BNP: Invalid input(s): POCBNP CBG: No results for input(s): GLUCAP in the last 168 hours.  Recent Results (from the past 240 hour(s))  Surgical pcr screen     Status: None   Collection Time: 08/21/2014 11:34 AM  Result Value Ref Range Status   MRSA, PCR NEGATIVE NEGATIVE Final   Staphylococcus aureus NEGATIVE NEGATIVE Final    Comment:        The Xpert  SA Assay (FDA approved for NASAL specimens in patients over 69 years of age), is one component of a comprehensive surveillance program.  Test performance has been validated by [facility] for patients greater than or equal to 65 year old. It is not intended to diagnose infection nor to guide or monitor treatment.   Urine culture     Status: None   Collection Time: 09/06/14 11:18 AM  Result Value Ref Range Status   Specimen Description URINE, RANDOM  Final   Special Requests NONE  Final   Culture NO GROWTH 1 DAY  Final   Report Status 09/07/2014 FINAL  Final     Studies:              All Imaging reviewed and is as per above notation   Scheduled Meds: . sodium chloride   Intravenous Once  . docusate sodium  100 mg Oral BID  . feeding supplement (ENSURE ENLIVE)  237 mL Oral BID BM  .  metoprolol tartrate  12.5 mg Oral Daily  . traMADol  50 mg Oral 4 times per day   Continuous Infusions: . sodium chloride 10 mL/hr at 08/15/2014 1211  . sodium chloride 75 mL/hr at 09/06/14 0850  . lactated ringers       Assessment/Plan:  Hip #-s/p R Hemiarthroplasty 6/24 1. per discretion Ortho services-appreciate their input into   Anticoagulation post-op--Cannot use ASA nor Coumadin-TEd hose thight high for now  Weight bearing tolerance for therapy services  Pain management-tramadol prn Follow-up services required  Abla 2/2 surgery Transfused 1 U PRBC 6/25 Transfusion threshold is ?Hb of 7 for anyone without CAD/ASCVD h/o ABLA is 1ry issue--probably severe tcp adds to this She also has significant bruising all over which could account for some blood loss Fecal occult performed by me and pending  Tachycardia  Metoprolol 12.5 initally held this am Nursing given parameters to hold and instructed to admin  AKI + hyperkalemia 2/2 to Abla Monitor Hb Transfuse as necessary Continue IV saline  Leukocytosis -post-op day 1 No obj TMAX Use IS and Ge tCXR in am Wound clean Urine culture ordered by Ortho is ND to date  TCP dating back as far as 2012 Get PLT smear Consider not using Coumadin going forward? I have asked for a tech review of periph smear Await call back from hematology re: best options going forward for VTE prohylaxis for 1 mo from surgery TED hose thigh high for now  Dementia Hold sedating meds   Code Status: full  Family Communication: long discussion with family 09/07/14 at bedside-undertsand Disposition Plan: SNF   Pleas Koch, MD  [facility] Pager [phone] 09/07/2014, 2:27 PM    LOS: 2 days

## 2014-09-08 ENCOUNTER — Inpatient Hospital Stay (HOSPITAL_COMMUNITY): Payer: Medicare Other

## 2014-09-08 ENCOUNTER — Encounter (HOSPITAL_COMMUNITY): Payer: Self-pay | Admitting: Radiology

## 2014-09-08 ENCOUNTER — Other Ambulatory Visit: Payer: Self-pay

## 2014-09-08 DIAGNOSIS — M25552 Pain in left hip: Secondary | ICD-10-CM

## 2014-09-08 DIAGNOSIS — T148 Other injury of unspecified body region: Secondary | ICD-10-CM

## 2014-09-08 LAB — CBC WITH DIFFERENTIAL/PLATELET
Basophils Absolute: 0 10*3/uL (ref 0.0–0.1)
Basophils Absolute: 0 10*3/uL (ref 0.0–0.1)
Basophils Relative: 0 % (ref 0–1)
Basophils Relative: 0 % (ref 0–1)
Eosinophils Absolute: 0 10*3/uL (ref 0.0–0.7)
Eosinophils Absolute: 0 10*3/uL (ref 0.0–0.7)
Eosinophils Relative: 0 % (ref 0–5)
Eosinophils Relative: 0 % (ref 0–5)
HEMATOCRIT: 24.9 % — AB (ref 36.0–46.0)
HEMATOCRIT: 22.1 % — AB (ref 36.0–46.0)
Hemoglobin: 8.6 g/dL — ABNORMAL LOW (ref 12.0–15.0)
Hemoglobin: 7.4 g/dL — ABNORMAL LOW (ref 12.0–15.0)
LYMPHS PCT: 6 % — AB (ref 12–46)
LYMPHS PCT: 2 % — AB (ref 12–46)
Lymphs Abs: 0.5 10*3/uL — ABNORMAL LOW (ref 0.7–4.0)
Lymphs Abs: 0.4 10*3/uL — ABNORMAL LOW (ref 0.7–4.0)
MCH: 29.3 pg (ref 26.0–34.0)
MCH: 29.2 pg (ref 26.0–34.0)
MCHC: 34.5 g/dL (ref 30.0–36.0)
MCHC: 33.5 g/dL (ref 30.0–36.0)
MCV: 84.7 fL (ref 78.0–100.0)
MCV: 87.4 fL (ref 78.0–100.0)
MONOS PCT: 3 % (ref 3–12)
Monocytes Absolute: 0.8 10*3/uL (ref 0.1–1.0)
Monocytes Absolute: 0.5 10*3/uL (ref 0.1–1.0)
Monocytes Relative: 8 % (ref 3–12)
NEUTROS ABS: 8.3 10*3/uL — AB (ref 1.7–7.7)
NEUTROS ABS: 16.8 10*3/uL — AB (ref 1.7–7.7)
NEUTROS PCT: 86 % — AB (ref 43–77)
Neutrophils Relative %: 95 % — ABNORMAL HIGH (ref 43–77)
PLATELETS: 45 10*3/uL — AB (ref 150–400)
PLATELETS: 87 10*3/uL — AB (ref 150–400)
RBC: 2.94 MIL/uL — ABNORMAL LOW (ref 3.87–5.11)
RBC: 2.53 MIL/uL — ABNORMAL LOW (ref 3.87–5.11)
RDW: 13.9 % (ref 11.5–15.5)
RDW: 14.2 % (ref 11.5–15.5)
WBC: 9.6 10*3/uL (ref 4.0–10.5)
WBC: 17.7 10*3/uL — ABNORMAL HIGH (ref 4.0–10.5)

## 2014-09-08 LAB — COMPREHENSIVE METABOLIC PANEL
ALBUMIN: 2.1 g/dL — AB (ref 3.5–5.0)
ALT: 13 U/L — AB (ref 14–54)
AST: 43 U/L — ABNORMAL HIGH (ref 15–41)
Alkaline Phosphatase: 50 U/L (ref 38–126)
Anion gap: 9 (ref 5–15)
BILIRUBIN TOTAL: 1.1 mg/dL (ref 0.3–1.2)
BUN: 50 mg/dL — ABNORMAL HIGH (ref 6–20)
CALCIUM: 7.6 mg/dL — AB (ref 8.9–10.3)
CHLORIDE: 100 mmol/L — AB (ref 101–111)
CO2: 27 mmol/L (ref 22–32)
CREATININE: 0.91 mg/dL (ref 0.44–1.00)
GFR calc Af Amer: >60 mL/min (ref >60)
GFR, EST NON AFRICAN AMERICAN: 54 mL/min — AB (ref >60)
Glucose, Bld: 179 mg/dL — ABNORMAL HIGH (ref 65–99)
POTASSIUM: 4.5 mmol/L (ref 3.5–5.1)
Sodium: 136 mmol/L (ref 135–145)
Total Protein: 4.4 g/dL — ABNORMAL LOW (ref 6.5–8.1)

## 2014-09-08 LAB — TYPE AND SCREEN
ABO/RH(D): A POS
Antibody Screen: NEGATIVE
UNIT DIVISION: 0

## 2014-09-08 LAB — BLOOD GAS, ARTERIAL
Acid-Base Excess: 1.7 mmol/L (ref 0.0–2.0)
BICARBONATE: 25.3 meq/L — AB (ref 20.0–24.0)
Drawn by: 44126
FIO2: 1 %
O2 SAT: 99.8 %
PATIENT TEMPERATURE: 98.6
PO2 ART: 290 mmHg — AB (ref 80.0–100.0)
TCO2: 26.5 mmol/L (ref 0–100)
pCO2 arterial: 37.2 mmHg (ref 35.0–45.0)
pH, Arterial: 7.448 (ref 7.350–7.450)

## 2014-09-08 LAB — TROPONIN I
Troponin I: 0.14 ng/mL — ABNORMAL HIGH (ref <0.031)
Troponin I: 0.37 ng/mL — ABNORMAL HIGH (ref <0.031)

## 2014-09-08 LAB — GLUCOSE, CAPILLARY: Glucose-Capillary: 162 mg/dL — ABNORMAL HIGH (ref 65–99)

## 2014-09-08 LAB — CLOSTRIDIUM DIFFICILE BY PCR: Toxigenic C. Difficile by PCR: NEGATIVE

## 2014-09-08 LAB — SURGICAL PCR SCREEN
MRSA, PCR: NEGATIVE
Staphylococcus aureus: NEGATIVE

## 2014-09-08 LAB — PROTIME-INR
INR: 1.19 (ref 0.00–1.49)
PROTHROMBIN TIME: 15.3 s — AB (ref 11.6–15.2)

## 2014-09-08 LAB — OCCULT BLOOD X 1 CARD TO LAB, STOOL: FECAL OCCULT BLD: POSITIVE — AB

## 2014-09-08 MED ORDER — IOHEXOL 350 MG/ML SOLN
80.0000 mL | Freq: Once | INTRAVENOUS | Status: AC | PRN
  Administered 2014-09-08: 70 mL via INTRAVENOUS

## 2014-09-08 MED ORDER — METOPROLOL TARTRATE 1 MG/ML IV SOLN
5.0000 mg | Freq: Once | INTRAVENOUS | Status: AC
  Administered 2014-09-08: 5 mg via INTRAVENOUS

## 2014-09-08 MED ORDER — LORAZEPAM 0.5 MG PO TABS
0.5000 mg | ORAL_TABLET | Freq: Once | ORAL | Status: AC
  Administered 2014-09-08: 0.5 mg via ORAL
  Filled 2014-09-08: qty 1

## 2014-09-08 MED ORDER — METOPROLOL TARTRATE 1 MG/ML IV SOLN
INTRAVENOUS | Status: AC
  Filled 2014-09-08: qty 5

## 2014-09-08 MED ORDER — LORAZEPAM 2 MG/ML IJ SOLN
0.5000 mg | Freq: Once | INTRAMUSCULAR | Status: AC
  Administered 2014-09-09: 0.5 mg via INTRAVENOUS
  Filled 2014-09-08: qty 1

## 2014-09-08 MED ORDER — VANCOMYCIN HCL IN DEXTROSE 750-5 MG/150ML-% IV SOLN
750.0000 mg | Freq: Once | INTRAVENOUS | Status: AC
Start: 2014-09-08 — End: 2014-09-08
  Administered 2014-09-08: 750 mg via INTRAVENOUS
  Filled 2014-09-08: qty 150

## 2014-09-08 MED ORDER — PIPERACILLIN-TAZOBACTAM 3.375 G IVPB
3.3750 g | Freq: Three times a day (TID) | INTRAVENOUS | Status: DC
  Administered 2014-09-09: 3.375 g via INTRAVENOUS
  Filled 2014-09-08 (×3): qty 50

## 2014-09-08 MED ORDER — VANCOMYCIN HCL 500 MG IV SOLR: 500.0000 mg | INTRAVENOUS | Status: DC

## 2014-09-08 MED ORDER — METOPROLOL TARTRATE 12.5 MG HALF TABLET
12.5000 mg | ORAL_TABLET | Freq: Two times a day (BID) | ORAL | Status: DC
  Filled 2014-09-08 (×2): qty 1

## 2014-09-08 MED ORDER — SODIUM CHLORIDE 0.9 % IV BOLUS (SEPSIS)
500.0000 mL | Freq: Once | INTRAVENOUS | Status: AC
  Administered 2014-09-08: 500 mL via INTRAVENOUS

## 2014-09-08 MED ORDER — METOPROLOL TARTRATE 1 MG/ML IV SOLN
2.5000 mg | Freq: Four times a day (QID) | INTRAVENOUS | Status: DC
  Administered 2014-09-09: 2.5 mg via INTRAVENOUS
  Filled 2014-09-08: qty 5

## 2014-09-08 MED ORDER — METOPROLOL TARTRATE 1 MG/ML IV SOLN
2.5000 mg | Freq: Four times a day (QID) | INTRAVENOUS | Status: DC
  Administered 2014-09-08: 2.5 mg via INTRAVENOUS
  Filled 2014-09-08 (×5): qty 5

## 2014-09-08 NOTE — Progress Notes (Signed)
Physical Therapy Treatment Patient Details Name: Amy KennedyLouise F Percle MRN: [id number] DOB: 08-30-1921 Today's Date: 09/08/2014    History of Present Illness Pt s/p fall and now s/p right hip hemi-arthoplasty as well as scapula fx treated conservatively    PT Comments    Judging from pt's past notes, pt w/ decline in cognitive status this session, unsure if due to medication or not. Pt extremely lethargic and requires VCs to open eyes.  Notified RN of concerns.  Pt required total assist +2 to achieve sitting EOB.  Pt's HR remains in the 120s-130s throughout session.  Pt will benefit from continued skilled PT services to increase functional independence and safety.   Follow Up Recommendations  SNF;Supervision/Assistance - 24 hour     Equipment Recommendations  None recommended by PT    Recommendations for Other Services       Precautions / Restrictions Precautions Precautions: Posterior Hip;Fall Precaution Comments: Pt unable to recall any precuations and all 3 were reviewed w/ pt prior to mobility Restrictions Weight Bearing Restrictions: Yes RUE Weight Bearing: Weight bearing as tolerated RLE Weight Bearing: Weight bearing as tolerated    Mobility  Bed Mobility Overal bed mobility: Needs Assistance;+2 for physical assistance Bed Mobility: Supine to Sit;Sit to Supine     Supine to sit: +2 for physical assistance;Total assist Sit to supine: +2 for physical assistance;Total assist   General bed mobility comments: Total assist for bed mobility w/ use of bed pad, managing BLEs and supporting trunk posteriorly.  Once sitting EOB pt unable to maintain balance.  Pt has eyes closed majority of session despite VCs to open them.  Transfers                 General transfer comment: deferred at this time 2/2 pt's supect cognitive status  Ambulation/Gait                 Stairs            Wheelchair Mobility    Modified Rankin (Stroke Patients Only)        Balance Overall balance assessment: Needs assistance Sitting-balance support: Bilateral upper extremity supported;Feet supported Sitting balance-Leahy Scale: Zero   Postural control: Posterior lean                          Cognition Arousal/Alertness: Suspect due to medications Behavior During Therapy: Flat affect Overall Cognitive Status: History of cognitive impairments - at baseline       Memory: Decreased recall of precautions              Exercises General Exercises - Lower Extremity Quad Sets: PROM;Both;5 reps;Supine Heel Slides: PROM;5 reps;Supine;Right Hip ABduction/ADduction: PROM;Right;5 reps;Supine    General Comments General comments (skin integrity, edema, etc.): Judging from pt's past notes, pt w/ decline in cognitive status this session, unsure if due to medication or not. Pt extremely lethargic and requires VCs to open eyes.  Notified RN of concerns.      Pertinent Vitals/Pain Pain Assessment: Faces Faces Pain Scale: Hurts little more Pain Location: B shoulders and RLE Pain Descriptors / Indicators: Aching;Grimacing;Moaning;Discomfort Pain Intervention(s): Limited activity within patient's tolerance;Monitored during session;Repositioned    Home Living                      Prior Function            PT Goals (current goals can now be found in the care plan section)  Acute Rehab PT Goals Patient Stated Goal: none stated PT Goal Formulation: With patient Time For Goal Achievement: 09/20/14 Potential to Achieve Goals: Good Progress towards PT goals: Not progressing toward goals - comment (2/2 severe lethargic state this session)    Frequency  Min 3X/week    PT Plan Current plan remains appropriate    Co-evaluation             End of Session   Activity Tolerance: Patient limited by fatigue;Patient limited by lethargy;Patient limited by pain Patient left: in bed;with call bell/phone within reach;with bed alarm set      Time: 1204-1219 PT Time Calculation (min) (ACUTE ONLY): 15 min  Charges:  $Therapeutic Activity: 8-22 mins                    G Codes:      Michail Jewels PT, [place] [phone] Pager: [phone] 09/08/2014, 1:24 PM

## 2014-09-08 NOTE — Significant Event (Signed)
Rapid Response Event Note  Overview: Time Called: 1315 Arrival Time: 1318 Event Type: Respiratory  Initial Focused Assessment: Patient in acute respiratory distress increased WOB using accessory muscles.  RR 44 O2 sats on RA 78 per staff.  Now on 4L [place] O2 sat 100% BP 86/57  ST 130  Temp 100.1 rectal Lung sounds clear, heart tones regular. Patient confused and hallucinating.  Not able to follow commands Skin pale  Interventions: Placed on 100% NRB no change in respiratory effort. ABG done PCXR done MD at bedside to assess patient MD spoke with son  CTA chest done  Transferred to 2c17  Via bed with O2 at 100% and heart monitor. RT placing patient on Bipap MD notified of transfer  Event Summary: Name of Physician Notified: Samtani at 1320    at    Outcome: Transferred (Comment)  Event End Time: 1509  Amy Ward, Amy Ward

## 2014-09-08 NOTE — Progress Notes (Addendum)
Shift event: Dr. Mahala MenghiniSamtani, attending, called this NP with update on today's occurrences with pt. Earlier today, pt had required Bipap and CXR/ABG was done. Dr. Mahala MenghiniSamtani asked this NP to check on pt. When arrived on unit, pt was in SVT with HR 150s. RR 40s with some increased WOB. BP 120s. O2 sat high 90s on 4L [place].  No hx is obtained from pt due to mental status.  Very frail, elderly WF in mild respiratory distress. She moans and mumbles words occasionally with tactile stimulation, but is very lethargic, doesn't follow commands and doesn't answer questions. VS as above.  12 lead reviewed and besides SVT, this NP doesn't see any acute changes. Troponin is slightly elevated. Suspect this is demand ischemia in setting of acute illness.  CXR shows likely PNA.  A/P: 1. Acute respiratory failure with PNA on CXR-WBCC up today. Restart bipap due to WOB increase. Vanc/Zosyn for HAP.  2. Elevated troponin-no acute EKG changes. Likely due to demand. Troponins being cycled. 3. SVT-d/c po Metoprolol due to lethargy. Metoprolol IV q6hrs and IV prn. HR down to 120 after Metoprolol and Bipap applied. BP is borderline, watch with BB. In her frail, elderly state, would not want to use Adenosine for SVT.  4. Dark stools-Cdiff and occult sent, pending. Hgb 7ish after surgery. Later, Cdiff neg. Occult +, r/p CBC ordered. Called son to update restart of bipap and SVT. He was thankful for the update.  Jimmye NormanKaren Kirby-Graham, NP [facility] Update: Pt was agitated on the bipap. Small dose of Ativan given. RR still high despite Bipap.  Later, RN called this NP again. To bedside urgently. Pt very pale and BP not registering on tele monitor. HR decreasing. Manual BP 100/40, HR decreasing to the 40s. Pt unresponsive now. Breathing per Bipap. Family made aware of change. Stayed at bedside to support pt. BP decreased to zero, asystole on monitor. Bipap removed and no respirations. No apical pulse ascultated by this NP and Marcelino DusterMichelle,  RN. Pt pronounced at 0110. Very peaceful death. Staff at bedside. This NP called son called back and informed him that his mom had passed away. Family is on the way to the hospital. Death certificate completed. Will offer chaplain services when family arrives.  Jimmye NormanKaren Kirby-Graham, NP [facility]

## 2014-09-08 NOTE — Plan of Care (Signed)
Problem: Consults Goal: Diagnosis- Total Joint Replacement Outcome: Progressing Primary Total Hip     

## 2014-09-08 NOTE — Care Management (Signed)
Important Message  Patient Details  Name: Amy Ward MRN: [id number] Date of Birth: 1921-04-05   Medicare Important Message Given:  Yes-second notification given    Orson Aloe 09/08/2014, 1:07 PM

## 2014-09-08 NOTE — Progress Notes (Signed)
ANTIBIOTIC CONSULT NOTE - INITIAL  Pharmacy Consult:  Vancomycin / Zosyn Indication:  HCAP  Allergies  Allergen Reactions  . Codeine Other (See Comments)    unknown    Patient Measurements: Height:  (157.5 cm) (family report) Weight: 93 lb 14.7 oz (42.6 kg) IBW/kg (Calculated) : 50.1  Vital Signs: Temp: 97.7 F (36.5 C) (06/27 1500) Temp Source: Axillary (06/27 1500) BP: 106/56 mmHg (06/27 1900) Pulse Rate: 121 (06/27 1800) Intake/Output from previous day: 06/26 0701 - 06/27 0700 In: 370 [P.O.:35; Blood:335] Out: -   Labs:  Recent Labs  09/06/14 0440  09/07/14 0405 09/08/14 0530 09/08/14 1615  WBC 18.5*  < > 13.7* 9.6 17.7*  HGB 8.0*  < > 6.8* 8.6* 7.4*  PLT 52*  < > 40* 45* 87*  CREATININE 1.66*  --  0.95  --  0.91  < > = values in this interval not displayed. Estimated Creatinine Clearance: 27.1 mL/min (by C-G formula based on Cr of 0.91). No results for input(s): VANCOTROUGH, VANCOPEAK, VANCORANDOM, GENTTROUGH, GENTPEAK, GENTRANDOM, TOBRATROUGH, TOBRAPEAK, TOBRARND, AMIKACINPEAK, AMIKACINTROU, AMIKACIN in the last 72 hours.   Microbiology: Recent Results (from the past 720 hour(s))  Surgical pcr screen     Status: None   Collection Time: 08/14/2014 11:34 AM  Result Value Ref Range Status   MRSA, PCR NEGATIVE NEGATIVE Final   Staphylococcus aureus NEGATIVE NEGATIVE Final    Comment:        The Xpert SA Assay (FDA approved for NASAL specimens in patients over 73 years of age), is one component of a comprehensive surveillance program.  Test performance has been validated by [facility] for patients greater than or equal to 50 year old. It is not intended to diagnose infection nor to guide or monitor treatment.   Urine culture     Status: None   Collection Time: 09/06/14 11:18 AM  Result Value Ref Range Status   Specimen Description URINE, RANDOM  Final   Special Requests NONE  Final   Culture NO GROWTH 1 DAY  Final   Report Status 09/07/2014  FINAL  Final  Surgical pcr screen     Status: None   Collection Time: 09/08/14  4:20 PM  Result Value Ref Range Status   MRSA, PCR NEGATIVE NEGATIVE Final   Staphylococcus aureus NEGATIVE NEGATIVE Final    Comment:        The Xpert SA Assay (FDA approved for NASAL specimens in patients over 55 years of age), is one component of a comprehensive surveillance program.  Test performance has been validated by [facility] for patients greater than or equal to 84 year old. It is not intended to diagnose infection nor to guide or monitor treatment.     Medical History: Past Medical History  Diagnosis Date  . Hypertension   . CKD (chronic kidney disease)   . Malnutrition       Assessment: 81 YOF known to Pharmacy from Coumadin dosing for VTE prophylaxis s/p right hemiarthroplasty.  Noted patient received Ancef post-op for surgical prophylaxis and now to start vancomycin and Zosyn for HCAP.  Noted patient's renal function is improving; however, she has reduced clearance given advanced age.  Vanc 6/27 >> Zosyn 6/27 >> Ancef 6/24 >> 6/25  6/25 UCx - negative 6/27 C.diff PCR - negative   Goal of Therapy:  Vancomycin trough level 15-20 mcg/ml   Plan:  - Vanc  IV x 1, then  IV Q24H - Zosyn 3.37gm IV Q8H, 4 hr infusion -  Monitor renal fxn, clinical progress, vanc trough as indicated - F/U resume Coumadin when possible    Avya Flavell D. Laney Potashang, PharmD, BCPS Pager:  [phone] 09/08/2014, 9:04 PM

## 2014-09-08 NOTE — Clinical Social Work Note (Signed)
Clinical Social Work Assessment  Patient Details  Name: Amy Ward MRN: [id number] Date of Birth: 1921/05/08  Date of referral:  09/08/14               Reason for consult:  Facility Placement, Discharge Planning                Permission sought to share information with:  Facility Medical sales representativeContact Representative, Family Supports Permission granted to share information::  Yes, Verbal Permission Granted  Name::     Concha PyoRonald Lahaie  [facility]  Relationship::  Son  Contact Information:  [phone]  Housing/Transportation Living arrangements for the past 2 months:  Independent Living Facility Transport planner([facility]) Source of Information:  Adult Children Patient Interpreter Needed:  None Criminal Activity/Legal Involvement Pertinent to Current Situation/Hospitalization:  No - Comment as needed Significant Relationships:  Adult Children Lives with:  Facility Resident, Self Do you feel safe going back to the place where you live?  No (High fall risk.) Need for family participation in patient care:  Yes (Comment) (Patient not fully alert and oriented.)  Care giving concerns:  Patient's son expressed no concerns at this time.   Social Worker assessment / plan:  CSW received referral for possible SNF placement at time of discharge. CSW spoke with patient's son, [name], regarding discharge disposition. Per patient's son, [facility] the [facility] two years. Patient's son understanding of PT recommendation for SNF placement, and stated [facility]. CSW to continue to follow and assist with discharge planning needs.  Employment status:  Retired Health and safety inspectornsurance information:  Teacher, English as a foreign languageManaged Medicare ([employer] [place]) PT Recommendations:  Skilled Nursing Facility Information / Referral to community resources:  Skilled Nursing Facility  Patient/Family's Response to care:  Patient's son understanding and agreeable to  CSW plan of care.  Patient/Family's Understanding of and Emotional Response to Diagnosis, Current Treatment, and Prognosis:  Patient's son understanding and agreeable to CSW plan of care.  Emotional Assessment Appearance:  Other (Comment Required (CSW spoke with patient's son, [name].) Attitude/Demeanor/Rapport:  Other (CSW spoke with patient's son, [name].) Affect (typically observed):  Other (CSW spoke with patient's son, [name].) Orientation:  Oriented to Self Alcohol / Substance use:  Not Applicable Psych involvement (Current and /or in the community):  No (Comment) (Not appropriate at this time.)  Discharge Needs  Concerns to be addressed:  No discharge needs identified Readmission within the last 30 days:  No Current discharge risk:  None Barriers to Discharge:  No Barriers Identified   [name], [name], LCSW 09/08/2014, 12:39 PM [phone]

## 2014-09-08 NOTE — Clinical Social Work Note (Signed)
Patient transferred from 5N to 2C. 5N CSW provided 2C CSW with handoff. 5N CSW signing off.  Marcelline Deist, [place] - [phone] Clinical Social Work Department Orthopedics [phone]) and Surgical [phone])

## 2014-09-08 NOTE — Clinical Documentation Improvement (Signed)
08/28/2014 H&P..."CKD (chronic kidney disease)"...  09/08/2014 07:24  Ref. Range 09/11/2014 03:46 08/29/2014 11:00 09/06/2014 04:40 09/07/2014 04:05 09/07/2014 18:14  EGFR (Non-African Amer.) Latest Ref Range: >60 mL/min 43 (L) 33 (L) 26 (L) 51 (L)   For accurate DX specificity & severity can noted "CKD" be further specified w/ stage. Thank you  Possible Clinical Conditions?  _______CKD Stage I - GFR > OR = 90 _______CKD Stage II - GFR 60-80 _______CKD Stage III - GFR 30-59 _______CKD Stage IV - GFR 15-29 _______CKD Stage V - GFR < 15 _______ESRD (End Stage Renal Disease) _______Other condition_____________ _______Cannot Clinically determine   Supporting Information: See above note  Thank You, Ermelinda Das, RN, BSN, CCDS Certified Clinical Documentation Specialist Pager: [place]: Health Information Management

## 2014-09-08 NOTE — Progress Notes (Signed)
Reassessed Comfy but doesn't like bipap ABG reviewed Might be able to wean likely needs anxiolytic and aggressive pulm toileting CTA no pe.  No PNA Get UA and Urine cult    Pleas Koch, MD [facility] [phone]

## 2014-09-08 NOTE — Progress Notes (Signed)
Pt transferred from CT at 1515 in unstable condition. Pt was originally on the RNF until she had labored breathing and AMS then transferred to 2C17 after going to CT. Pt was transported on the unit before report was called. Respiratory at the bedside. Pt belongings not with patient nor transferred down. Pt had 1 hearing aide lying on her bed on arrival. Family in route. Plan of care to be updated and continued.

## 2014-09-08 NOTE — Progress Notes (Signed)
Subjective: 3 Days Post-Op Procedure(s) (LRB): ARTHROPLASTY BIPOLAR HIP (HEMIARTHROPLASTY) (Right) Patient reports pain as mild.    Objective: Vital signs in last 24 hours: Temp:  [97.7 F (36.5 C)-100.1 F (37.8 C)] 97.7 F (36.5 C) (06/27 1500) Pulse Rate:  [102-152] 102 (06/27 1600) Resp:  [16-55] 43 (06/27 1600) BP: (86-167)/(47-150) 142/69 mmHg (06/27 1600) SpO2:  [83 %-100 %] 83 % (06/27 1600) FiO2 (%):  [100 %] 100 % (06/27 1531) Weight:  [42.6 kg (93 lb 14.7 oz)] 42.6 kg (93 lb 14.7 oz) (06/27 1500)  Intake/Output from previous day: 06/26 0701 - 06/27 0700 In: 370 [P.O.:35; Blood:335] Out: -  Intake/Output this shift: Total I/O In: 120 [P.O.:120] Out: -    Recent Labs  09/06/14 0440 09/06/14 1530 09/07/14 0405 09/08/14 0530 09/08/14 1615  HGB 8.0* 8.0* 6.8* 8.6* 7.4*    Recent Labs  09/08/14 0530 09/08/14 1615  WBC 9.6 17.7*  RBC 2.94* 2.53*  HCT 24.9* 22.1*  PLT 45* 87*    Recent Labs  09/07/14 0405 09/08/14 1615  NA 135 136  K 4.8 4.5  CL 101 100*  CO2 29 27  BUN 31* 50*  CREATININE 0.95 0.91  GLUCOSE 116* 179*  CALCIUM 8.1* 7.6*    Recent Labs  09/06/14 0440 09/08/14 1615  INR 1.07 1.19    Incision: scant drainage and mepilex dsg coming off  No cellulitis present  Assessment/Plan: 3 Days Post-Op Procedure(s) (LRB): ARTHROPLASTY BIPOLAR HIP (HEMIARTHROPLASTY) (Right)  Also with R scapular body fracture, WBAT RUE Up with therapy WBAT RLE, post hip precautions Dressing change as needed  We were giving tramadol prn pain not sure if affecting cognitive status Holding anticoagulation due to abla and tcp, thight high stockings dvt proph  Nurse in room obtaining urine culture and placing foley cath advised on dsg change Will continue to follow, appreciate medicine participation in care  Margart Sickles 09/08/2014, 5:32 PM

## 2014-09-08 NOTE — Progress Notes (Signed)
Amy Ward WLK:[id number] DOB: October 07, 1921 DOA: Sep 20, 2014 PCP: Darnelle Bos, MD  Brief narrative:  81 y/o nursing home resident with a bland medical history other than tachycardia admitted to the hospital early on 09/01/14 with fall and hip fracture at ALF where she stays She is very HOH but family tells me she lives pretty independently att facility and is relatively high functioning She seems to be doing well post-op I have d/c her IV morphine/Reglans and placed her on scheduled Tramadol as she will need pain control-this is NOT to be given if patient is asleep  She was found to have ABLA and transfused with platelets dropping into the 40's Hematology consulted  Past medical history-As per Problem list Chart reviewed as below-  Consultants:  Ortho  Procedures:  Hemiarthroplasty 6/24  Antibiotics:  none   Subjective   Continued confusion Pleasant otherwsie Given IVF boluses overnight for ? HR No cp [place]   Addend-I was called by RN at 13:30 for patient acutely decompensating at 12:45 with sob and tachycardia with low bp and low O2 sats   Objective    Interim History:   Telemetry:    Objective: Filed Vitals:   09/07/14 1944 09/08/14 0243 09/08/14 0634 09/08/14 1030  BP: 140/53 124/47 153/69   Pulse: 124 114 145 117  Temp: 99.4 F (37.4 C) 98.4 F (36.9 C) 98.1 F (36.7 C)   TempSrc: Axillary Axillary Oral   Resp: 16 16 24    Height:      Weight:      SpO2: 96% 95% 96%     Intake/Output Summary (Last 24 hours) at 09/08/14 1105 Last data filed at 09/08/14 0730  Gross per 24 hour  Intake    465 ml  Output      0 ml  Net    465 ml    Exam:  General: eomi, ncat-arcus Cardiovascular: s1 s 2no /r/g Respiratory:  clear Abdomen: soft nt,nd Skin no le edema-bruising throughout-large bruise to the Post R shoulder with a scab Neuro grossy intact but confused  Data Reviewed: Basic Metabolic Panel:  Recent Labs Lab 2014-09-20 0346  20-Sep-2014 1100 09/06/14 0440 09/07/14 0405  NA 142 141 137 135  K 4.4 5.6* 5.1 4.8  CL 102 104 104 101  CO2 30 28 27 29   GLUCOSE 172* 145* 127* 116*  BUN 28* 28* 37* 31*  CREATININE 1.10* 1.36* 1.66* 0.95  CALCIUM 8.8* 8.7* 7.6* 8.1*   Liver Function Tests:  Recent Labs Lab 09-20-2014 1100 09/06/14 0440  AST 27 30  ALT 24 14  ALKPHOS 43 38  BILITOT 0.6 0.4  PROT 5.4* 4.7*  ALBUMIN 3.2* 2.7*   No results for input(s): LIPASE, AMYLASE in the last 168 hours. No results for input(s): AMMONIA in the last 168 hours. CBC:  Recent Labs Lab 2014-09-20 0346 2014-09-20 1100 09/06/14 0440 09/06/14 1530 09/07/14 0405 09/08/14 0530  WBC 15.4* 15.3* 18.5* 23.2* 13.7* 9.6  NEUTROABS 12.6*  --  16.1* 20.4* 12.0* 8.3*  HGB 13.1 12.0 8.0* 8.0* 6.8* 8.6*  HCT 39.3 36.8 24.3* 24.1* 20.4* 24.9*  MCV 87.9 88.2 88.7 87.3 86.8 84.7  PLT 71* 68* 52* 47* 40* 45*   Cardiac Enzymes: No results for input(s): CKTOTAL, CKMB, CKMBINDEX, TROPONINI in the last 168 hours. BNP: Invalid input(s): POCBNP CBG: No results for input(s): GLUCAP in the last 168 hours.  Recent Results (from the past 240 hour(s))  Surgical pcr screen     Status: None   Collection  Time: 08/28/2014 11:34 AM  Result Value Ref Range Status   MRSA, PCR NEGATIVE NEGATIVE Final   Staphylococcus aureus NEGATIVE NEGATIVE Final    Comment:        The Xpert SA Assay (FDA approved for NASAL specimens in patients over 35 years of age), is one component of a comprehensive surveillance program.  Test performance has been validated by [facility] for patients greater than or equal to 93 year old. It is not intended to diagnose infection nor to guide or monitor treatment.   Urine culture     Status: None   Collection Time: 09/06/14 11:18 AM  Result Value Ref Range Status   Specimen Description URINE, RANDOM  Final   Special Requests NONE  Final   Culture NO GROWTH 1 DAY  Final   Report Status 09/07/2014 FINAL  Final      Studies:              All Imaging reviewed and is as per above notation   Scheduled Meds: . sodium chloride   Intravenous Once  . docusate sodium  100 mg Oral BID  . feeding supplement (ENSURE ENLIVE)  237 mL Oral BID BM  . metoprolol  2.5 mg Intravenous 4 times per day  . traMADol  50 mg Oral 4 times per day   Continuous Infusions: . sodium chloride 10 mL/hr at 09/02/2014 1211  . sodium chloride 75 mL/hr at 09/06/14 0850  . lactated ringers       Assessment/Plan:  ?severe sepsis vs PE Not on AC 2/2 to low PLT count and risk for bleed  DDX aspiration [has fever]  State abg/CTA chest/CMet/INR/CBC  Start Bipap  Transfer SDU Long discussion with son on phone updating him-confirms DNAR.  Would like work-up done up however Will reassess later today   Hip #-s/p R Hemiarthroplasty 6/24 1. per discretion Ortho services-appreciate their input into   Anticoagulation post-op--Cannot use ASA nor Coumadin-TEd hose thight high for now  Weight bearing tolerance for therapy services  Pain management-tramadol prn Follow-up services required  Abla 2/2 surgery Transfused 1 U PRBC 6/25 Transfusion threshold is ?Hb of 7 for anyone without CAD/ASCVD h/o ABLA is 1ry issue--probably severe tcp adds to this She also has significant bruising all over which could account for some blood loss Fecal occult performed was neg  Tachycardia  Metoprolol 12.5 initally held -I have increased this to bid dosing and may increase to TID 12.5 on d/c to SNF Nursing given parameters to hold and instructed to admin  AKI + hyperkalemia 2/2 to Abla Monitor Hb Transfuse as necessary Continue IV saline  Leukocytosis-no resolved 6/27 -post-op day related No obj TMAX Wound clean Urine culture ordered by Ortho is ND to date  TCP dating back as far as 2012 Get PLT smear Consider not using Coumadin going forward? I have asked for a tech review of periph smear D/r Dr. Shirline Frees of Hematology He  states it would be reasonable to start [facility]. Or NOAC if PLT count above 50 TED hose thigh high for now  Dementia Hold sedating meds   Code Status: full  Family Communication:no family + currently Disposition Plan: SNF   Pleas Koch, MD  [facility] Pager [phone] 09/08/2014, 11:05 AM    LOS: 3 days

## 2014-09-09 ENCOUNTER — Encounter (HOSPITAL_COMMUNITY): Payer: Self-pay | Admitting: Orthopedic Surgery

## 2014-09-09 LAB — HEMOGLOBIN AND HEMATOCRIT, BLOOD
HCT: 21.4 % — ABNORMAL LOW (ref 36.0–46.0)
Hemoglobin: 7 g/dL — ABNORMAL LOW (ref 12.0–15.0)

## 2014-09-10 LAB — URINE CULTURE: Culture: NO GROWTH

## 2014-09-12 NOTE — Significant Event (Signed)
Expressed condolences to Son and offered hypothesis of Acute LGIB as etiology for A hypoxic resp failure Will forward d/c summary to primary and make office aware  Pleas KochJai Giani Betzold, MD [facility] [phone]

## 2014-09-12 NOTE — Discharge Summary (Signed)
Death Summary  Amy Ward [id number] DOB: 1922/02/24 DOA: 08/24/2014  PCP: Darnelle BosSBORNE,JAMES CHARLES, MD PCP/Office notified: Yes-note forwarded  Admit date: 08/19/2014 Date of Death: 12/03/14  Final Diagnoses:  Principal Problem:   Hip fracture Active Problems:   Pulmonary nodule   GERD (gastroesophageal reflux disease)   Scapular fracture   Tachycardia   Fall   Right hip pain   S/P hip hemiarthroplasty   Protein-calorie malnutrition, severe   Thrombocytopenia, idiopathic   Left hip pain     History of present illness:   81 y/o nursing home resident with a bland medical history other than tachycardia admitted to the hospital early on 09/01/14 with fall and hip fracture at ALF where she stays She is very HOH but family tells me she lives pretty independently att facility and is relatively high functioning She seems to be doing well post-op  d/c her IV morphine/Reglans and placed her on scheduled Tramadol as she will need pain control-this is NOT to be given if patient is asleep  She was found to have ABLA and transfused with platelets dropping into the 40's Hematology consulted who [facility] if plt above 50 Patient was placed on TEG hose Thigh high for 1ry prevention of DVT. Patient had acute cardio-respiratory decompensation peri-operatively with tachyarrhythmia and hypoxia which was of uncertain etiology She was transferred to SDU on Bi-pap but couldn't;t tolerate this She started to pass bloody stools leading me to think she had a subacute GI bleed She expired early 6.26.16    Time: 25  Signed:  Rhetta MuraSAMTANI, JAI-GURMUKH  [facility] 12/03/14, 7:31 AM

## 2014-09-12 DEATH — deceased

## 2014-11-03 IMAGING — CT CT ABD-PELV W/ CM
2 of 5 series · 16 of 46 positions shown, 18 images · IV contrast (omnipaque)
Comparison: None.

CLINICAL DATA: Emesis.

CT ABDOMEN AND PELVIS WITH CONTRAST
TECHNIQUE: Multidetector CT imaging of the abdomen and pelvis was
performed following the standard protocol during bolus
administration of intravenous contrast.
Contrast: 80mL OMNIPAQUE IOHEXOL 300 MG/ML  SOLN

[Series 2: routine · axial · 0.71mm/px · z∈[-342,+28]mm · 13 of 84 slices shown, 15 images]
[im 5/84  soft-tissue]
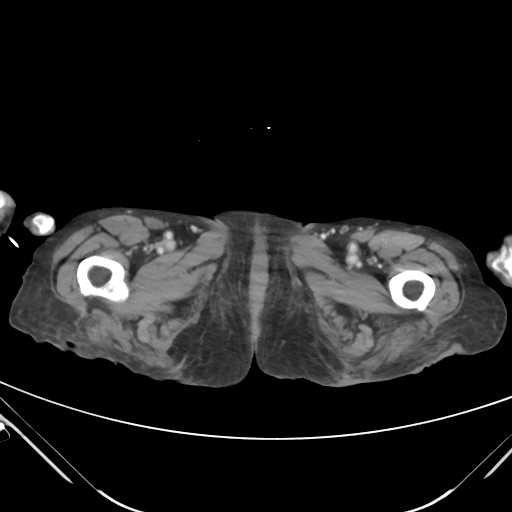
[im 5/84  bone]
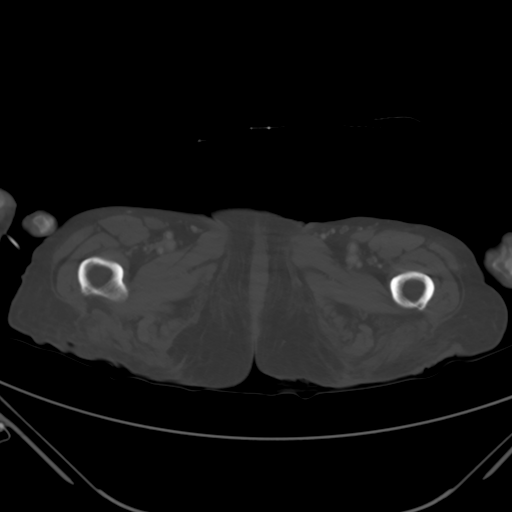
[im 14/84  soft-tissue]
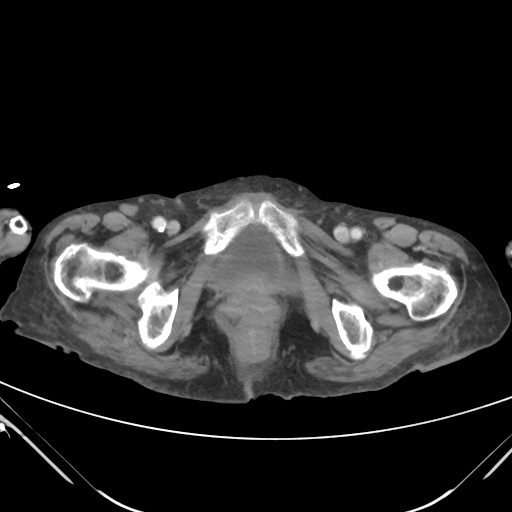
[im 18/84  soft-tissue]
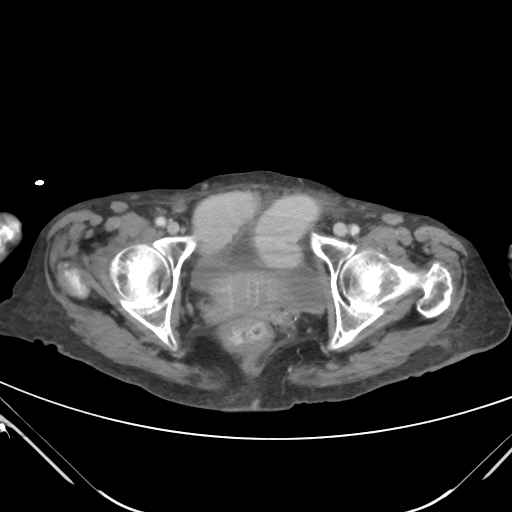
[im 22/84  soft-tissue]
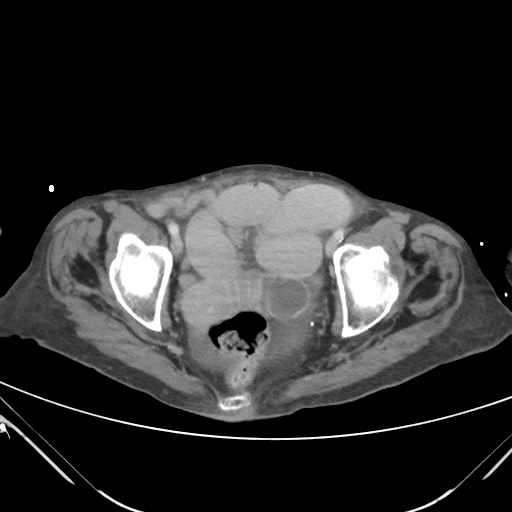
[im 31/84  soft-tissue]
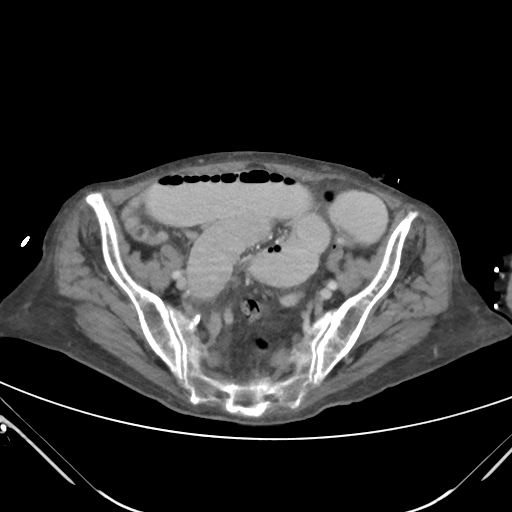
[im 35/84  soft-tissue]
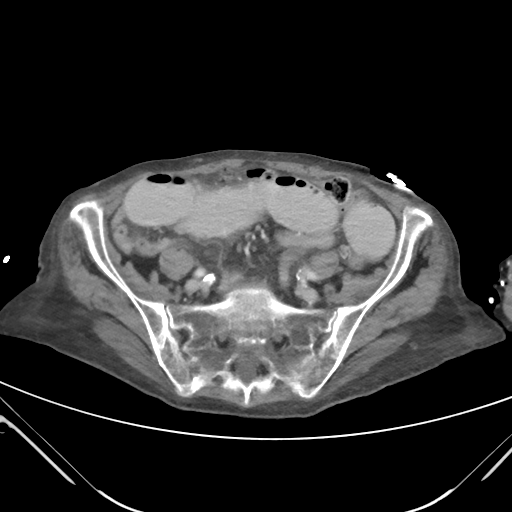
[im 44/84  soft-tissue]
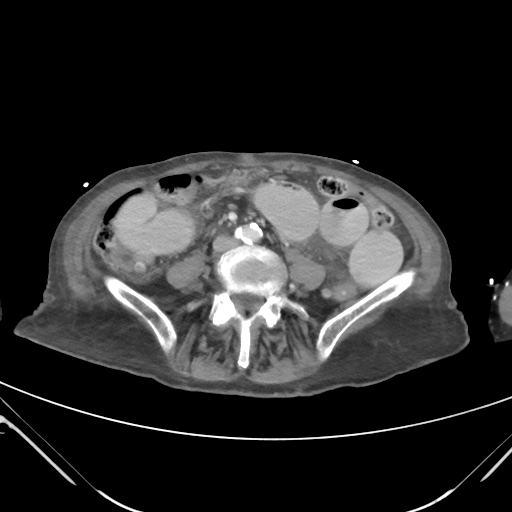
[im 49/84  soft-tissue]
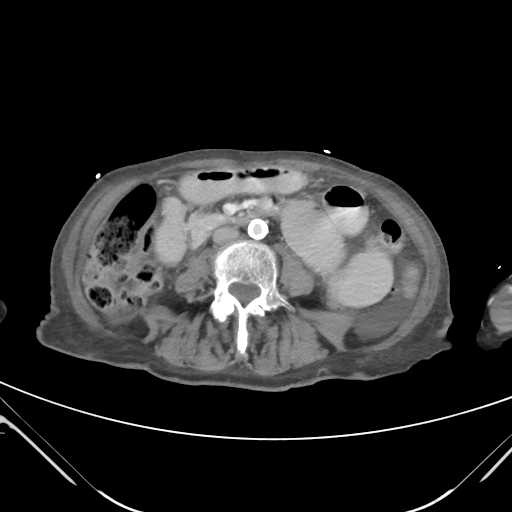
[im 53/84  soft-tissue]
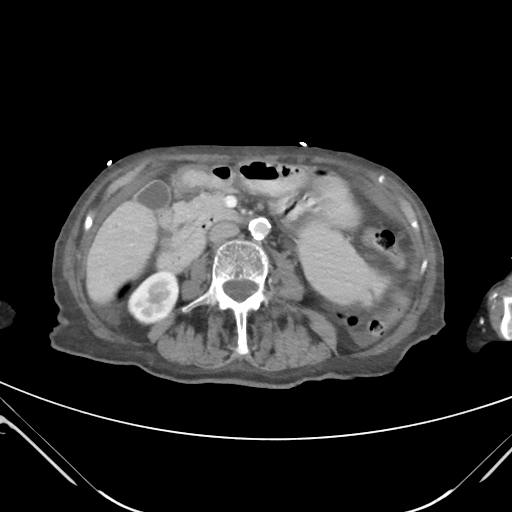
[im 53/84  bone]
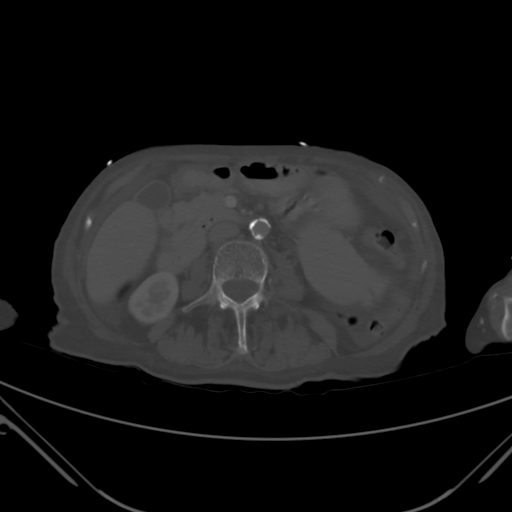
[im 62/84  soft-tissue]
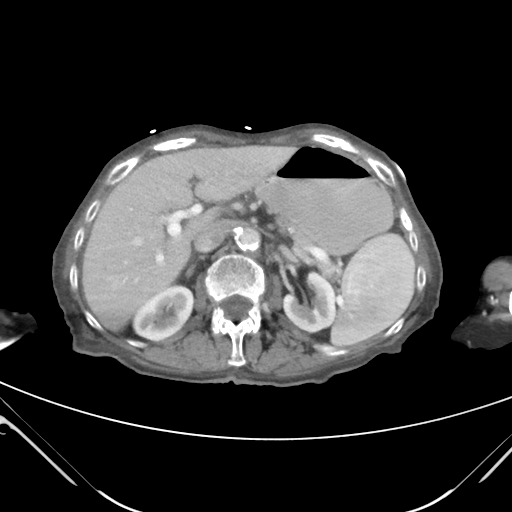
[im 66/84  soft-tissue]
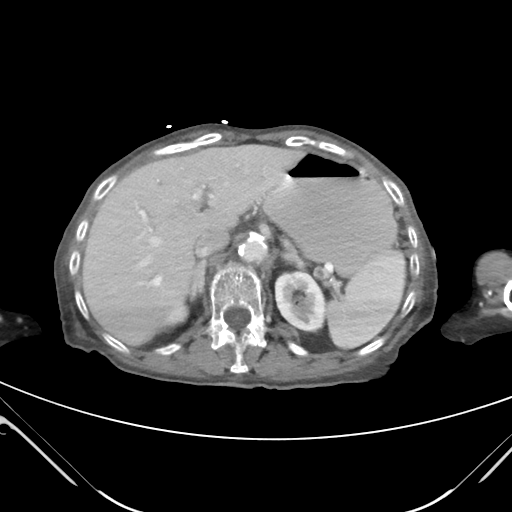
[im 70/84  soft-tissue]
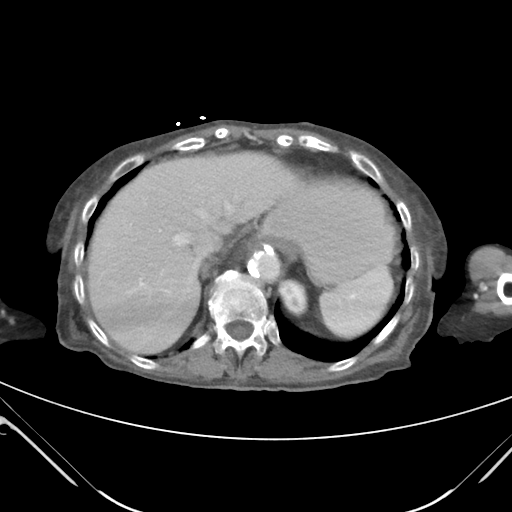
[im 79/84  soft-tissue]
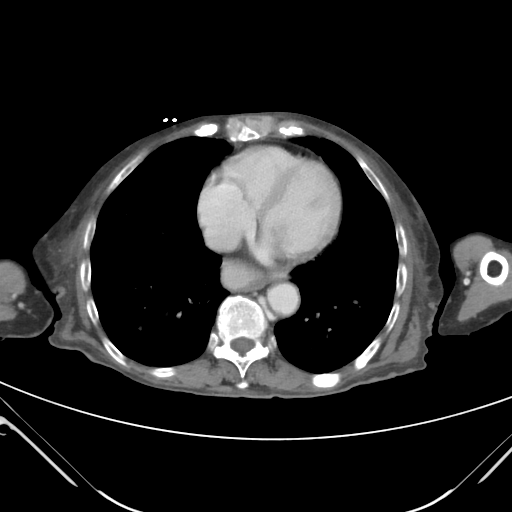

[coronals · coronal · 0.81mm/px · 3 of 73 slices shown]
[im 25/73  soft-tissue]
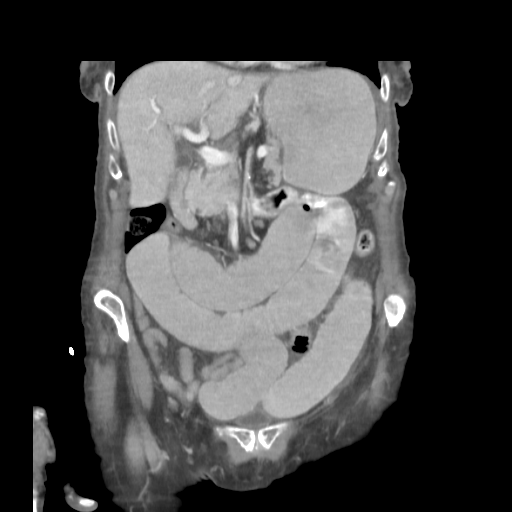
[im 33/73  soft-tissue]
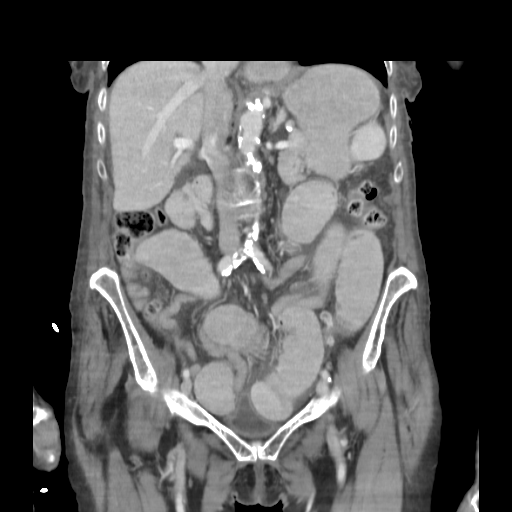
[im 41/73  soft-tissue]
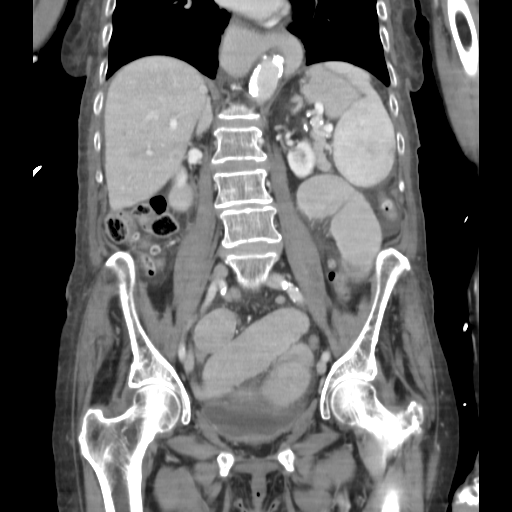

[16 of 46 positions shown; findings below may reference images not displayed]

FINDINGS: BODY WALL: Unremarkable.

LOWER CHEST:

Mediastinum:  Moderate hiatal hernia with non clearance or reflux
of enteric contrast.  Distal esophageal thickening likely from
reflux esophagitis.

Lungs/pleura: No consolidation.There are numerous small peripheral
pulmonary nodules, some which are calcified.  Most are 5 mm or
smaller.

ABDOMEN/PELVIS:

Liver: No focal abnormality.

Biliary: No evidence of biliary obstruction or stone.

Pancreas: Unremarkable.

Spleen: Unremarkable.

Adrenals: Bilateral adrenal thickening without focal nodule.

Kidneys and ureters: Punctate stone in the lower pole right kidney.
Tiny cortical low attenuation foci, likely cysts.

Bladder: Unremarkable.

Bowel: Dilated and fluid-filled proximal small bowel, measuring up
to 3.8 cm.  There is an abrupt transition point in the low left
pelvis.  The segment of bowel beyond the transition point shows
marked mural thickening and poor mucosal enhancement.

The distal bowel is decompressed.  Extensive colonic
diverticulosis.

No evidence of pneumatosis or free air.  No evidence of large
vessel occlusion, although limited for this purpose.

Retroperitoneum: No mass or adenopathy.

Peritoneum: Small-volume ascites.  No free air.

Reproductive: Hysterectomy.

Vascular: Extensive atherosclerotic calcification. 1 cm distal
splenic artery aneurysm.

OSSEOUS: No acute abnormalities. No suspicious lytic or blastic
lesions.

Critical Value/emergent results were called by telephone at the
time of interpretation on 10/13/2012 at [DATE] to Dr Mehilli, who
verbally acknowledged these results.
IMPRESSION: 1.  High-grade proximal small bowel obstruction with transition in
the left pelvis, likely from adhesions.  The bowel just beyond the
transition point is edematous and possibly ischemic.
2. Numerous small pulmonary nodules. Remote granulomatous infection
or neoplastic disease are the primary considerations.  After
convalescence, recommend chest CT.

3. Hiatal hernia with reflux or poor distal esophageal clearance.

4.  1 cm splenic artery aneurysm.

## 2014-11-05 IMAGING — RF DG ABDOMEN 1V
1 series · 1 of 1 positions shown · non-contrast
Comparison: 10/15/2012 abdominal radiograph

CLINICAL DATA: 89-year-old female - NG tube placement.

ABDOMEN - 1 VIEW

[Series 1: run · 1 of 1 slices shown]
[im 1/1]
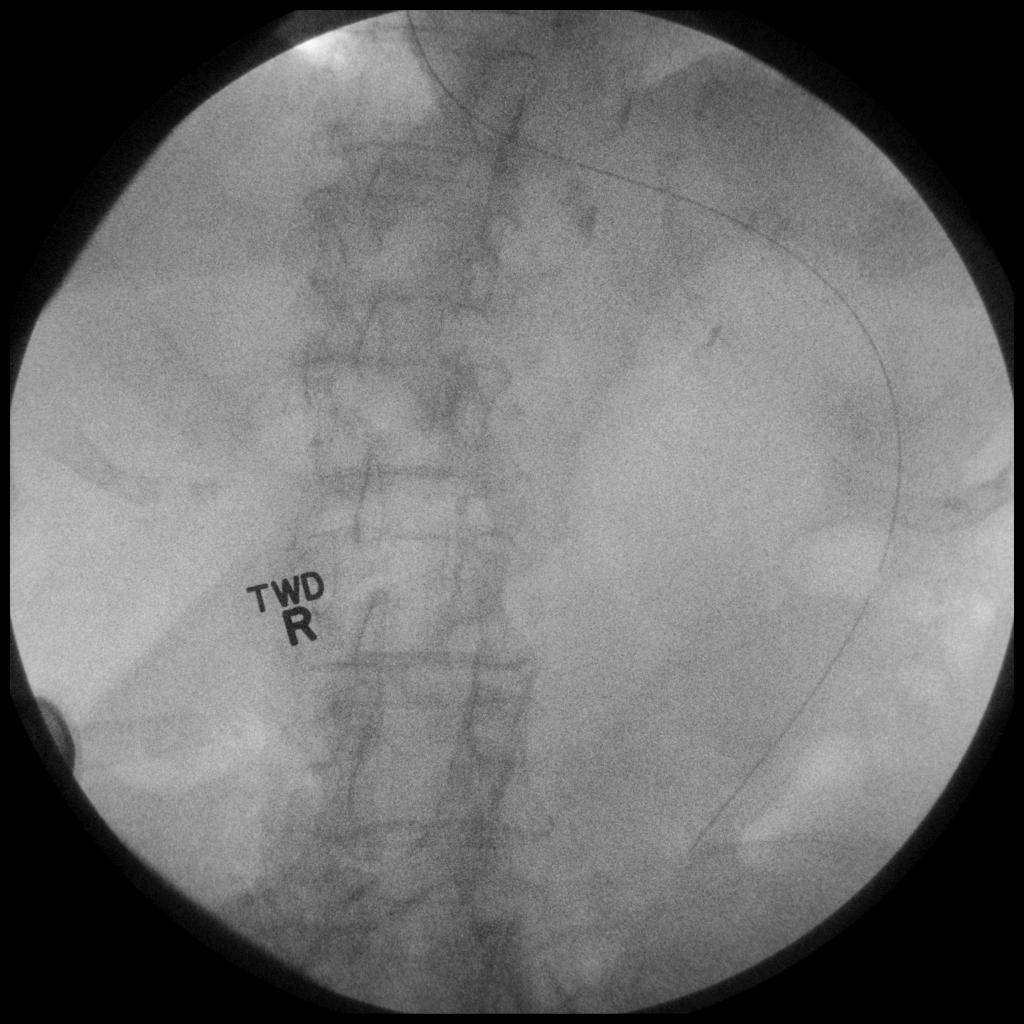

[1 of 1 positions shown; findings below may reference images not displayed]

FINDINGS: An NG tube has been placed under fluoroscopic guidance by
radiology personnel.
The NG tube tip overlies the region of the mid stomach.
IMPRESSION: NG tube tip overlies the region of the mid stomach.
